# Patient Record
Sex: Female | Born: 1970 | Race: White | Hispanic: No | Smoking: Never smoker
Health system: Southern US, Community
[De-identification: ages and names within clinical notes are randomized; demographics above are authoritative.]

## PROBLEM LIST (undated history)

## (undated) DIAGNOSIS — E039 Hypothyroidism, unspecified: Secondary | ICD-10-CM

## (undated) DIAGNOSIS — F329 Major depressive disorder, single episode, unspecified: Secondary | ICD-10-CM

## (undated) DIAGNOSIS — F32A Depression, unspecified: Secondary | ICD-10-CM

## (undated) DIAGNOSIS — N950 Postmenopausal bleeding: Secondary | ICD-10-CM

## (undated) DIAGNOSIS — D649 Anemia, unspecified: Secondary | ICD-10-CM

## (undated) HISTORY — PX: TUBAL LIGATION: SHX77

## (undated) HISTORY — PX: CARPAL TUNNEL RELEASE: SHX101

---

## 2002-12-06 ENCOUNTER — Other Ambulatory Visit: Admission: RE | Admit: 2002-12-06 | Discharge: 2002-12-06 | Payer: Self-pay | Admitting: Obstetrics and Gynecology

## 2003-06-15 ENCOUNTER — Inpatient Hospital Stay (HOSPITAL_COMMUNITY): Admission: AD | Admit: 2003-06-15 | Discharge: 2003-06-17 | Payer: Self-pay | Admitting: Obstetrics and Gynecology

## 2003-06-18 ENCOUNTER — Encounter: Admission: RE | Admit: 2003-06-18 | Discharge: 2003-07-18 | Payer: Self-pay | Admitting: Obstetrics and Gynecology

## 2003-07-22 ENCOUNTER — Other Ambulatory Visit: Admission: RE | Admit: 2003-07-22 | Discharge: 2003-07-22 | Payer: Self-pay | Admitting: Obstetrics and Gynecology

## 2003-12-10 HISTORY — PX: CARPAL TUNNEL RELEASE: SHX101

## 2004-05-18 ENCOUNTER — Ambulatory Visit (HOSPITAL_COMMUNITY): Admission: RE | Admit: 2004-05-18 | Discharge: 2004-05-18 | Payer: Self-pay | Admitting: Orthopaedic Surgery

## 2004-05-28 ENCOUNTER — Ambulatory Visit (HOSPITAL_BASED_OUTPATIENT_CLINIC_OR_DEPARTMENT_OTHER): Admission: RE | Admit: 2004-05-28 | Discharge: 2004-05-28 | Payer: Self-pay | Admitting: Orthopaedic Surgery

## 2004-12-08 ENCOUNTER — Encounter (INDEPENDENT_AMBULATORY_CARE_PROVIDER_SITE_OTHER): Payer: Self-pay | Admitting: Specialist

## 2004-12-08 ENCOUNTER — Ambulatory Visit (HOSPITAL_COMMUNITY): Admission: RE | Admit: 2004-12-08 | Discharge: 2004-12-08 | Payer: Self-pay | Admitting: Obstetrics and Gynecology

## 2004-12-09 HISTORY — PX: TUBAL LIGATION: SHX77

## 2005-09-26 ENCOUNTER — Inpatient Hospital Stay (HOSPITAL_COMMUNITY): Admission: AD | Admit: 2005-09-26 | Discharge: 2005-09-28 | Payer: Self-pay | Admitting: Obstetrics and Gynecology

## 2005-11-12 ENCOUNTER — Ambulatory Visit (HOSPITAL_COMMUNITY): Admission: RE | Admit: 2005-11-12 | Discharge: 2005-11-12 | Payer: Self-pay | Admitting: Obstetrics and Gynecology

## 2008-11-23 ENCOUNTER — Ambulatory Visit (HOSPITAL_COMMUNITY): Admission: RE | Admit: 2008-11-23 | Discharge: 2008-11-23 | Payer: Self-pay | Admitting: Orthopaedic Surgery

## 2009-01-10 ENCOUNTER — Emergency Department (HOSPITAL_COMMUNITY): Admission: EM | Admit: 2009-01-10 | Discharge: 2009-01-10 | Payer: Self-pay | Admitting: Family Medicine

## 2011-02-06 ENCOUNTER — Other Ambulatory Visit (HOSPITAL_COMMUNITY): Payer: Self-pay | Admitting: Orthopaedic Surgery

## 2011-02-06 DIAGNOSIS — M4802 Spinal stenosis, cervical region: Secondary | ICD-10-CM

## 2011-02-13 ENCOUNTER — Ambulatory Visit (HOSPITAL_COMMUNITY)
Admission: RE | Admit: 2011-02-13 | Discharge: 2011-02-13 | Disposition: A | Discharge status: Home / Self Care | Payer: 59 | Source: Ambulatory Visit | Attending: Orthopaedic Surgery | Admitting: Orthopaedic Surgery

## 2011-02-13 DIAGNOSIS — M6281 Muscle weakness (generalized): Secondary | ICD-10-CM | POA: Insufficient documentation

## 2011-02-13 DIAGNOSIS — M79609 Pain in unspecified limb: Secondary | ICD-10-CM | POA: Insufficient documentation

## 2011-02-13 DIAGNOSIS — M542 Cervicalgia: Secondary | ICD-10-CM | POA: Insufficient documentation

## 2011-02-13 DIAGNOSIS — J320 Chronic maxillary sinusitis: Secondary | ICD-10-CM | POA: Insufficient documentation

## 2011-02-13 DIAGNOSIS — M4802 Spinal stenosis, cervical region: Secondary | ICD-10-CM | POA: Insufficient documentation

## 2011-02-13 DIAGNOSIS — J323 Chronic sphenoidal sinusitis: Secondary | ICD-10-CM | POA: Insufficient documentation

## 2011-04-26 NOTE — Op Note (Signed)
Karen [name], Karen [name]            ACCOUNT NO.:  [id number]   MEDICAL RECORD NO.:  [id number]          PATIENT TYPE:  AMB   LOCATION:  SDC                           FACILITY:  [facility]   PHYSICIAN:  Lenoard Aden, M.D.DATE OF BIRTH:  1971/07/30   DATE OF PROCEDURE:  12/08/2004  DATE OF DISCHARGE:                                 OPERATIVE REPORT   PREOPERATIVE DIAGNOSIS:  Missed abortion.   POSTOPERATIVE DIAGNOSIS:  Missed abortion.   PROCEDURE:  1.  Suction, dilatation and evacuation.  2.  Examination under anesthesia.   SURGEON:  Lenoard Aden, M.D.   ANESTHESIA:  MAC; paracervical.   ESTIMATED BLOOD LOSS:  Less than 50 mL.   COMPLICATIONS:  None.   SPECIMENS:  Products of conception to pathology.   OPERATIVE NOTE:  After being apprised of the risks of anesthesia, infection,  bleeding, uterine perforation and possible need for repair, the patient was  brought to the operating room where she was administered IV sedation without  difficulty, prepped and draped in the usual sterile fashion, catheterized  until the bladder was emptied.  After achieving adequate anesthesia, a  dilute Nesacaine solution was placed, 25 mL, in a standard paracervical  block.  At this time, uterus was dilated easily up to a #21 Pratt dilator, 7-  mm suction curette placed.  Minimal tissue is obtained due to known 6-week  missed Ab with 4-week sac as previously noted on ultrasound, but adequate  tissue is noted.  Blunt curettage reveals the cavity to be empty, repeat  suction confirms.  Good hemostasis is noted.  The procedure is terminated.  The patient is awakened and transferred to recovery in good condition.      RJT/MEDQ  D:  12/08/2004  T:  12/08/2004  Job:  [id number]   cc:   [facility]

## 2011-04-26 NOTE — H&P (Signed)
   Karen [name], ZAPPULLA NO.:  [id number]   MEDICAL RECORD NO.:  [id number]                   PATIENT TYPE:  MAT   LOCATION:  MATC                                 FACILITY:  [facility]   PHYSICIAN:  Lenoard Aden, M.D.             DATE OF BIRTH:  Feb 22, 1971   DATE OF ADMISSION:  06/15/2003  DATE OF DISCHARGE:                                HISTORY & PHYSICAL   CHIEF COMPLAINT:  Oligohydramnios.   HISTORY OF PRESENT ILLNESS:  The patient is a 40 year old, white female, G1,  P0, EDC July 05, 2003 at [redacted] weeks gestation with persistent refractory  oligohydramnios and an AFI of 6.5 today.   MEDICATIONS:  Prenatal vitamins.   ALLERGIES:  No known drug allergies.   OBSTETRIC HISTORY:  Noncontributory.   PAST MEDICAL HISTORY:  Remarkable for history of migraine headaches and  wisdom teeth removal.   FAMILY HISTORY:  Myocardial infarction, heart disease, hypertension, insulin  dependent diabetes and smoking abuse.   PRENATAL LAB DATA:  Reveals a blood type of O positive, Rh antibody  negative, rubella immune, hepatitis and HIV negative. GBS negative.   PHYSICAL EXAMINATION:  GENERAL:  She is a well-developed, well-nourished,  white female in no acute distress.  HEENT:  Normal.  LUNGS:  Clear.  HEART:  Regular rhythm.  ABDOMEN:  Soft, gravid, nontender. Estimated fetal weight on ultrasound of 7  pounds. Cervix is 4 cm, 80% , vertex -1  EXTREMITIES:  Show no cores.  NEUROLOGIC:  Nonfocal.   IMPRESSION:  1. 37 week intrauterine pregnancy.  2. Refractory oligohydramnios with AFI decreased with bed rest.   PLAN:  Proceed with induction of labor, anticipate attempts at vaginal  delivery.                                               Lenoard Aden, M.D.   RJT/MEDQ  D:  06/15/2003  T:  06/15/2003  Job:  [id number]

## 2011-04-26 NOTE — Op Note (Signed)
Karen [name], POLITTE                        ACCOUNT NO.:  [id number]   MEDICAL RECORD NO.:  [id number]                   PATIENT TYPE:  AMB   LOCATION:  DSC                                  FACILITY:  [facility]   PHYSICIAN:  Mark C. Ophelia Charter, M.D.                 DATE OF BIRTH:  Nov 17, 1971   DATE OF PROCEDURE:  05/28/2004  DATE OF DISCHARGE:                                 OPERATIVE REPORT   PREOPERATIVE DIAGNOSIS:  Right carpal tunnel syndrome.   POSTOPERATIVE DIAGNOSIS:  Right carpal tunnel syndrome.   PROCEDURE:  Right carpal tunnel release.   SURGEON:  Mark C. Ophelia Charter, M.D.   ANESTHESIA:  IV sedation plus local 0.5% Marcaine plus 1% Xylocaine 1:1  mixture.   TOURNIQUET TIME:  8 minutes.   PROCEDURE:  After standard prepping and draping with 8-0 Vicryl sedation, a  sterile skin marker was used and incision was made following the thenar  crease.  The transverse carpal ligament was divided distally and then  followed proximally along the ulnar border of the median nerve, dividing the  transverse carpal ligament completely.  A fingertip could be placed  proximally and distally.  There were chronic tenosynovitis changes, no  masses underneath the nerve after irrigation with saline solution.  The  tourniquet was deflated, small bleeders controlled with bipolar cautery.  The skin was closed with 4-0 nylon AO suture, mattress technique, Xeroform,  4 x 4's, Webril, and a dorsal wrist splint with the wrist in slight  extension.  Instrument count and needle count was correct.                                               Mark C. Ophelia Charter, M.D.    MCY/MEDQ  D:  05/28/2004  T:  05/29/2004  Job:  [id number]

## 2011-04-26 NOTE — H&P (Signed)
NAMELEKIA, NIER            ACCOUNT NO.:  [id number]   MEDICAL RECORD NO.:  [id number]          PATIENT TYPE:  AMB   LOCATION:  SDC                           [facility]ILITY[facility]  [facility]   PHYSICIAN:  Lenoard Aden, M.D.DATE OF BIRTH:  Mar 01, 1971   DATE OF ADMISSION:  12/08/2004  DATE OF DISCHARGE:                                HISTORY & PHYSICAL   CHIEF COMPLAINT  Missed abortion.   HISTORY OF PRESENT ILLNESS:  The patient is a 40 year old white female G2,  P1, with missed abortion at six weeks gestation for definitive care.   MEDICATIONS:  Prenatal vitamins.   ALLERGIES:  No known drug allergies.   OBSTETRIC HISTORY:  One vaginal delivery in 2004, history of migraine  headaches and wisdom tooth removal.   FAMILY HISTORY:  Myocardial infarction, heart disease, hypertension, insulin  dependent diabetes mellitus, smoking abuse.   Prenatal labs revealed a blood type of O positive.   PHYSICAL EXAMINATION:  GENERAL:  Well developed, well nourished white female in no acute distress.  HEENT:  Normal.  LUNGS:  Clear.  HEART:  Regular rhythm.  ABDOMEN:  Soft, nontender.  Uterus consistent with six weeks size.  EXTREMITIES:  No edema.  NEUROLOGICAL:  Nonfocal.   IMPRESSION:  Six week intrauterine pregnancy with missed abortion.   PLAN:  Proceed with suction D&E.  The risks of anesthesia, infection,  bleeding, injury to abdominal organs were discussed, complications to  include bowel and bladder injury with uterine perforation discussed.  The  patient acknowledges and wishes to proceed.      RJT/MEDQ  D:  12/07/2004  T:  12/07/2004  Job:  [id number]

## 2011-04-26 NOTE — H&P (Signed)
NAME:  Karen [name], MUNNERLYN NO.:  [id number]   MEDICAL RECORD NO.:  [id number]          PATIENT TYPE:  MAT   LOCATION:  [facility]C                          [facility]ILITY[facility]   PHYSICIAN:  Lenoard Aden, M.D.DATE OF BIRTH:  [birth year]   DATE OF ADMISSION:  DATE OF DISCHARGE:                                HISTORY & PHYSICAL   CHIEF COMPLAINT:  History of precipitous labor.   She is a 40 year old white female, G3, P1, EDC of October 07, 2005 at [redacted]  weeks gestation for induction. She has a history of spontaneous vaginal  delivery, missed AB with D&E.   MEDICATIONS:  Prenatal vitamins.   ALLERGIES:  None.   OBSTETRICAL HISTORY:  As noted.   PAST MEDICAL HISTORY:  Remarkable for migraine headaches and wisdom tooth  removal.   FAMILY HISTORY:  Myocardial infarction, heart disease, hypertension, insulin-  dependent diabetes, and smoking abuse.   PRENATAL LABORATORY DATA:  Blood type O positive. Rh antibody negative.  Rubella immuned. Hepatitis and HIV negative. GBS is negative.   PHYSICAL EXAMINATION:  GENERAL:  She is a well-developed, well-nourished  white female in no acute distress.  HEENT:  Normal.  LUNGS:  Clear.  HEART:  Regular rate and rhythm.  ABDOMEN:  Soft, gravid, nontender. Estimated fetal weight of 7 pounds.  CERVIX:  3 cm dilated, 2 cm soft, vertex -1.  EXTREMITIES:  No cords.  NEUROLOGICAL:  Nonfocal.   IMPRESSION:  A 39-week intrauterine pregnancy for elective induction due to  maternal discomfort and favorable cervical status.   PLAN:  Proceed with induction with epidural as needed. Anticipate attempt at  vaginal delivery.      Lenoard Aden, M.D.  Electronically Signed     RJT/MEDQ  D:  09/25/2005  T:  09/25/2005  Job:  [id number]

## 2011-04-26 NOTE — Op Note (Signed)
[name], Karen [name]       ACCOUNT NO.:  [id number]   MEDICAL RECORD NO.:  [id number]          PATIENT TYPE:  AMB   LOCATION:  SDC                           [facility]   PHYSICIAN:  Lenoard Aden, M.D.DATE OF BIRTH:  14-Aug-1971   DATE OF PROCEDURE:  11/12/2005  DATE OF DISCHARGE:                                 OPERATIVE REPORT   PREOPERATIVE DIAGNOSIS:  Desire for elective sterilization.   POSTOPERATIVE DIAGNOSIS:  Desire for elective sterilization.   OPERATION/PROCEDURE:  Laparoscopic tubal sterilization.   SURGEON:  Lenoard Aden, M.D.   ANESTHESIA:  General by Maren Beach, M.D.   ESTIMATED BLOOD LOSS:  Less than 50 mL.   COMPLICATIONS:  None.   DRAINS:  None.   COUNTS:  Correct.   DISPOSITION:  Patient to recovery in good condition.   DESCRIPTION OF PROCEDURE:  After being apprised of the risks of anesthesia,  infection, bleeding, injury to abdominal organs and need for repair delayed  or unforeseen complications to include bowel and bladder injury,  the  patient was brought to the operating room where she was administered a  general anesthetic without complications.  She was placed in the dorsal  lithotomy position.  Feet were in the ___________ stirrups.  Hulka tenaculum  was placed per vagina without complications.  The patient was prepped and  draped in the usual sterile fashion and catheterized until the bladder was  empty.  After this time infraumbilical incision was made with the scalpel.  Veress needle was placed.  Opening pressure -2 noted.  A 0.5 L CO2 was  insufflated without difficulty.  Trocar placed atraumatic.  Trocar entry  visualized.  Pictures are taken.  Normal liver and gallbladder bed.  Normal  appendiceal area.  Normal anterior posterior cul-de-sac.  Normal uterus.  Normal tubes and normal ovaries.  Right tube is traced out to the fimbriated  end.  Ampulla isthmic portion of the tube is cauterized using bipolar  cautery  to a resistance of 0 in three contiguous areas.  The same procedure  on the left tube.  Both tubes were then divided using scissors.  Tubal  lumens were visualized.  Good hemostasis is noted.  Instruments are removed.  CO2 is released and 0 Vicryl interrupted suture placed in the umbilicus.  Dermabond placed.  The patient tolerated the procedure well and was  transferred to recovery in good condition.      Lenoard Aden, M.D.  Electronically Signed     RJT/MEDQ  D:  11/12/2005  T:  11/12/2005  Job:  [id number]

## 2011-05-14 ENCOUNTER — Other Ambulatory Visit: Payer: Self-pay | Admitting: Obstetrics and Gynecology

## 2011-12-14 ENCOUNTER — Emergency Department (HOSPITAL_COMMUNITY)
Admission: EM | Admit: 2011-12-14 | Discharge: 2011-12-14 | Disposition: A | Discharge status: Home / Self Care | Payer: 59 | Source: Home / Self Care | Attending: Emergency Medicine | Admitting: Emergency Medicine

## 2011-12-14 ENCOUNTER — Encounter: Payer: Self-pay | Admitting: *Deleted

## 2011-12-14 DIAGNOSIS — J40 Bronchitis, not specified as acute or chronic: Secondary | ICD-10-CM

## 2011-12-14 DIAGNOSIS — J069 Acute upper respiratory infection, unspecified: Secondary | ICD-10-CM

## 2011-12-14 DIAGNOSIS — J329 Chronic sinusitis, unspecified: Secondary | ICD-10-CM

## 2011-12-14 HISTORY — DX: Major depressive disorder, single episode, unspecified: F32.9

## 2011-12-14 HISTORY — DX: Depression, unspecified: F32.A

## 2011-12-14 MED ORDER — HYDROCOD POLST-CHLORPHEN POLST 10-8 MG/5ML PO LQCR: 5.0000 mL | Freq: Two times a day (BID) | ORAL | Status: DC | PRN

## 2011-12-14 MED ORDER — AMOXICILLIN-POT CLAVULANATE 875-125 MG PO TABS: 1.0000 | ORAL_TABLET | Freq: Two times a day (BID) | ORAL | Status: AC

## 2011-12-14 NOTE — ED Provider Notes (Signed)
Chief Complaint  Patient presents with  . Nasal Congestion  . 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 [employer] had a four-day history of cough productive of thick yellowish sputum, nasal congestion with yellow-green drainage, sinus pressure, heaviness in the chest, chest hurts to cough, has felt hot and cold, and has had a sore throat. She denies any difficulty breathing or wheezing. She's had no earache, no nausea, vomiting, or diarrhea. She has been exposed to several patients that had pneumonia. She has gotten the flu vaccine.  Review of Systems:  Other than noted above, the patient denies any of the following symptoms. Systemic:  No fever, chills, sweats, fatigue, myalgias, headache, or anorexia. Eye:  No redness, pain or drainage. ENT:  No earache, nasal congestion, rhinorrhea, sinus pressure, or sore throat. Lungs:  No cough, sputum production, wheezing, shortness of breath. Or chest pain. GI:  No nausea, vomiting, abdominal pain or diarrhea.  PMFSH:  Past medical history, family history, social history, meds, and allergies were reviewed.  Physical Exam:   Vital signs:  BP 124/53  Pulse 92  Temp(Src) 99.6 F (37.6 C) (Oral)  Resp 16  SpO2 100%  LMP 12/12/2011 General:  Alert, in no distress. Eye:  No conjunctival injection or drainage. ENT:  TMs and canals were normal, without erythema or inflammation.  Nasal mucosa was clear and uncongested, without drainage.  Mucous membranes were moist.  Pharynx was clear, without exudate or drainage.  There were no oral ulcerations or lesions. Neck:  Supple, no adenopathy, tenderness or mass. Lungs:  No respiratory distress.  Lungs were clear to auscultation, without wheezes, rales or rhonchi.  Breath sounds were clear and equal bilaterally. Heart:  Regular rhythm, without gallops, murmers or rubs. Skin:  Clear, warm, and dry, without rash or lesions.  Labs:   Results for orders placed during the  hospital encounter of 01/10/09  POCT RAPID STREP A      Component Value Range   Streptococcus, Group A Screen (Direct) POSITIVE (*) NEGATIVE      Radiology:  No results found.  Medications given in UCC:  None  Assessment:   Diagnoses that have been ruled out:  Diagnoses that are still under consideration:  Final diagnoses:  Upper respiratory infection  Sinusitis  Bronchitis    Plan:   1.  The following meds were prescribed:   New Prescriptions   AMOXICILLIN-CLAVULANATE (AUGMENTIN) 875-125 MG PER TABLET    Take 1 tablet by mouth 2 (two) times daily.   CHLORPHENIRAMINE-HYDROCODONE (TUSSIONEX) 10-8 MG/5ML LQCR    Take 5 mLs by mouth every 12 (twelve) hours as needed.   2.  The patient was instructed in symptomatic care and handouts were given. 3.  The patient was told to return if becoming worse in any way, if no better in 3 or 4 days, and given some red flag symptoms that would indicate earlier return.   Roque Lias, MD 12/14/11 [phone]

## 2011-12-14 NOTE — ED Notes (Signed)
Cough congestion x 3 days

## 2012-08-20 ENCOUNTER — Other Ambulatory Visit (HOSPITAL_COMMUNITY): Payer: Self-pay | Admitting: Orthopaedic Surgery

## 2012-08-20 DIAGNOSIS — M542 Cervicalgia: Secondary | ICD-10-CM

## 2012-08-24 ENCOUNTER — Ambulatory Visit (HOSPITAL_COMMUNITY)
Admission: RE | Admit: 2012-08-24 | Discharge: 2012-08-24 | Disposition: A | Discharge status: Home / Self Care | Payer: 59 | Source: Ambulatory Visit | Attending: Orthopaedic Surgery | Admitting: Orthopaedic Surgery

## 2012-08-24 ENCOUNTER — Inpatient Hospital Stay (HOSPITAL_COMMUNITY)
Admission: RE | Admit: 2012-08-24 | Discharge: 2012-08-24 | Payer: 59 | Source: Ambulatory Visit | Attending: Orthopaedic Surgery | Admitting: Orthopaedic Surgery

## 2012-08-24 DIAGNOSIS — R5381 Other malaise: Secondary | ICD-10-CM | POA: Insufficient documentation

## 2012-08-24 DIAGNOSIS — M79609 Pain in unspecified limb: Secondary | ICD-10-CM | POA: Insufficient documentation

## 2012-08-24 DIAGNOSIS — M542 Cervicalgia: Secondary | ICD-10-CM | POA: Insufficient documentation

## 2012-10-15 ENCOUNTER — Encounter (HOSPITAL_COMMUNITY): Payer: Self-pay

## 2012-10-15 ENCOUNTER — Ambulatory Visit (HOSPITAL_COMMUNITY)
Admission: RE | Admit: 2012-10-15 | Discharge: 2012-10-15 | Disposition: A | Discharge status: Home / Self Care | Payer: 59 | Source: Ambulatory Visit | Attending: Orthopaedic Surgery | Admitting: Orthopaedic Surgery

## 2012-10-15 DIAGNOSIS — Z01812 Encounter for preprocedural laboratory examination: Secondary | ICD-10-CM | POA: Insufficient documentation

## 2012-10-15 DIAGNOSIS — Z01818 Encounter for other preprocedural examination: Secondary | ICD-10-CM | POA: Insufficient documentation

## 2012-10-15 HISTORY — DX: Anemia, unspecified: D64.9

## 2012-10-15 LAB — CBC
HCT: 41.9 % (ref 36.0–46.0)
MCH: 25.9 pg — ABNORMAL LOW (ref 26.0–34.0)
MCHC: 31.5 g/dL (ref 30.0–36.0)
MCV: 82.2 fL (ref 78.0–100.0)
WBC: 8.9 10*3/uL (ref 4.0–10.5)

## 2012-10-15 LAB — SURGICAL PCR SCREEN: MRSA, PCR: NEGATIVE

## 2012-10-15 NOTE — Progress Notes (Signed)
Dr. Ophelia Charter office notified that orders are needed for patient.

## 2012-10-15 NOTE — Pre-Procedure Instructions (Signed)
20 Karen [name]  10/15/2012   Your procedure is scheduled on:  10-23-2012  Report to [facility] at 5:30 AM.  Karen [name] to 3rd floor  Call this number if you have problems the morning of surgery: [phone]   Remember:   Do not eat food or drink:After Midnight.   .  Take these medicines the morning of surgery with A SIP OF WATER: lexapro,pain medication as needed,methocarbamol if needed,   Do not wear jewelry, make-up or nail polish.  Do not wear lotions, powders, or perfumes. You may wear deodorant.  Do not shave 48 hours prior to surgery  Do not bring valuables to the hospital.  Contacts, dentures or bridgework may not be worn into surgery.  Leave suitcase in the car. After surgery it may be brought to your room.   For patients admitted to the hospital, checkout time is 11:00 AM the day of discharge.   Patients discharged the day of surgery will not be allowed to drive home.      Special Instructions: Shower using CHG 2 nights before surgery and the night before surgery.  If you shower the day of surgery use CHG.  Use special wash - you have one bottle of CHG for all showers.  You should use approximately 1/3 of the bottle for each shower.     Please read over the following fact sheets that you were given: Pain Booklet, MRSA Information and Surgical Site Infection Prevention

## 2012-10-19 ENCOUNTER — Other Ambulatory Visit (HOSPITAL_COMMUNITY): Payer: Self-pay | Admitting: Orthopaedic Surgery

## 2012-10-22 MED ORDER — CEFAZOLIN SODIUM-DEXTROSE 2-3 GM-% IV SOLR
2.0000 g | INTRAVENOUS | Status: AC
  Administered 2012-10-23: 2 g via INTRAVENOUS
  Filled 2012-10-22: qty 50

## 2012-10-22 NOTE — H&P (Signed)
Karen [name] is an 41 y.o. female.   Chief Complaint: neck pain with right arm pain, numbness and tingling HPI:  She is having persistent problems with her cervical spondylosis and right upper extremity numbness.  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  Has increased problems when she works.  She has some difficulty sleeping.  She has to take Vicodin at night.  She is using anti-inflammatories during the day.  She has been concerned the anti-inflammatories will give her some problems with her stomach and has been taking them sometimes every other day.  She has had previous carpal tunnel release on the right side.  Most of her symptoms are on the right.  Numbness radiates down to the radial side of her hand.  It is worse with activity.  She denies any bowel or bladder symptoms.  No lower extremity weakness.  No falling or stumbling. She has used home cervical traction.  Formal physical therapy was ordered.  However, her out-of-pocket cost was $40.00 per visit and the cost was prohibitive for her.  She has used the anti-inflammatories and has used some heat, ice, heating pad and home cervical traction regularly which gives her a little bit of improvement.   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  She has arm pain, numbness in her hands, difficulty sleeping at night.  She states she is ready to proceed with surgery in the form of anterior cervical discectomy and fusion.   Her MRI scan shows disk desiccation with bulging with mild stenosis at C5-6 and C6-7.  She has more symptoms at C5-6 on the right which is consistent with her intermittent hand numbness.  Her symptoms have been present since 2008 and she has been followed for the last 5 years for gradual progression  Past Medical History  Diagnosis Date  . Depression   . Anemia     Past  Surgical History  Procedure Date  . Carpal tunnel release   . Tubal ligation     History reviewed. No pertinent family history. Social History:  reports that she has never smoked. She does not have any smokeless tobacco history on file. She reports that she drinks alcohol. She reports that she does not use illicit drugs.  Allergies: No Known Allergies  Medications Prior to Admission  Medication Sig Dispense Refill  . docusate sodium (COLACE) 100 MG capsule Take 200 mg by mouth daily.      Marland Kitchen escitalopram (LEXAPRO) 20 MG tablet Take 20 mg by mouth daily.      Marland Kitchen guaiFENesin (MUCINEX) 600 MG 12 hr tablet Take 1,200 mg by mouth 2 (two) times daily as needed. For allergies      . HYDROcodone-acetaminophen (NORCO/VICODIN) 5-325 MG per tablet Take 1-2 tablets by mouth 2 (two) times daily as needed. For pain      . methocarbamol (ROBAXIN) 500 MG tablet Take 500 mg by mouth every 6 (six) hours as needed. For spasms      . norethindrone-ethinyl estradiol (JUNEL FE,GILDESS FE,LOESTRIN FE) 1-20 MG-MCG tablet Take 1 tablet by mouth daily.        Results for orders placed during the hospital encounter of 10/23/12 (from the past 48 hour(s))  COMPREHENSIVE METABOLIC PANEL     Status: Abnormal   Collection Time   10/23/12  5:50 AM      Component  Value Range Comment   Sodium 136  135 - 145 mEq/L    Potassium 3.4 (*) 3.5 - 5.1 mEq/L    Chloride 102  96 - 112 mEq/L    CO2 24  19 - 32 mEq/L    Glucose, Bld 123 (*) 70 - 99 mg/dL    BUN 12  6 - 23 mg/dL    Creatinine, Ser 4.09  0.50 - 1.10 mg/dL    Calcium 9.3  8.4 - 81.1 mg/dL    Total Protein 7.2  6.0 - 8.3 g/dL    Albumin 3.5  3.5 - 5.2 g/dL    AST 14  0 - 37 U/L    ALT 18  0 - 35 U/L    Alkaline Phosphatase 60  39 - 117 U/L    Total Bilirubin 0.5  0.3 - 1.2 mg/dL    GFR calc non Af Amer >90  >90 mL/min    GFR calc Af Amer >90  >90 mL/min   URINALYSIS, ROUTINE W REFLEX MICROSCOPIC     Status: Abnormal   Collection Time   10/23/12  6:12 AM       Component Value Range Comment   Color, Urine YELLOW  YELLOW    APPearance CLOUDY (*) CLEAR    Specific Gravity, Urine 1.020  1.005 - 1.030    pH 5.0  5.0 - 8.0    Glucose, UA NEGATIVE  NEGATIVE mg/dL    Hgb urine dipstick NEGATIVE  NEGATIVE    Bilirubin Urine NEGATIVE  NEGATIVE    Ketones, ur NEGATIVE  NEGATIVE mg/dL    Protein, ur NEGATIVE  NEGATIVE mg/dL    Urobilinogen, UA 0.2  0.0 - 1.0 mg/dL    Nitrite NEGATIVE  NEGATIVE    Leukocytes, UA SMALL (*) NEGATIVE   URINE MICROSCOPIC-ADD ON     Status: Abnormal   Collection Time   10/23/12  6:12 AM      Component Value Range Comment   Squamous Epithelial / LPF FEW (*) RARE    WBC, UA 0-2  <3 WBC/hpf    RBC / HPF 0-2  <3 RBC/hpf    Bacteria, UA RARE  RARE    No results found.  Review of Systems  Constitutional: Negative.   HENT: Positive for neck pain.   Eyes: Negative.   Respiratory: Negative.   Cardiovascular: Negative.   Gastrointestinal: Negative.   Genitourinary: Negative.   Skin: Negative.   Neurological: Positive for tingling.       Right arm  Endo/Heme/Allergies: Negative.   Psychiatric/Behavioral: Negative.     Blood pressure 131/66, pulse 92, temperature 98.2 F (36.8 C), temperature source Oral, resp. rate 18, SpO2 98.00%. Physical Exam  Constitutional: She is oriented to person, place, and time. She appears well-developed and well-nourished.  HENT:  Head: Normocephalic and atraumatic.  Eyes: EOM are normal. Pupils are equal, round, and reactive to light.  Neck:       Decreased ROM cspine  Cardiovascular: Normal rate.   Respiratory: Effort normal.  GI: Soft.  Musculoskeletal:       PHYSICAL EXAMINATION:  The patient is alert and oriented in mild distress.  Height 5 feet 2 inches, weight 174 pounds.  She has brachial plexus tenderness on the right.  Upper extremity reflexes are 1+ and symmetric and normal lower extremity reflexes.  Positive Spurling on the right and negative on the left  Neurological: She  is alert and oriented to person, place, and time.  Skin: Skin is warm  and dry.  Psychiatric: She has a normal mood and affect.     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  Ireene Ballowe C 10/23/2012, 7:20 AM

## 2012-10-23 ENCOUNTER — Encounter (HOSPITAL_COMMUNITY): Payer: Self-pay | Admitting: Anesthesiology

## 2012-10-23 ENCOUNTER — Ambulatory Visit (HOSPITAL_COMMUNITY): Payer: 59

## 2012-10-23 ENCOUNTER — Encounter (HOSPITAL_COMMUNITY)
Admission: RE | Disposition: A | Discharge status: Home / Self Care | Payer: Self-pay | Source: Ambulatory Visit | Attending: Orthopaedic Surgery

## 2012-10-23 ENCOUNTER — Encounter (HOSPITAL_COMMUNITY): Payer: Self-pay | Admitting: *Deleted

## 2012-10-23 ENCOUNTER — Ambulatory Visit (HOSPITAL_COMMUNITY): Payer: 59 | Admitting: Anesthesiology

## 2012-10-23 ENCOUNTER — Inpatient Hospital Stay (HOSPITAL_COMMUNITY)
Admission: RE | Admit: 2012-10-23 | Discharge: 2012-10-24 | DRG: 473 | Disposition: A | Discharge status: Home / Self Care | Payer: 59 | Source: Ambulatory Visit | Attending: Orthopaedic Surgery | Admitting: Orthopaedic Surgery

## 2012-10-23 DIAGNOSIS — F329 Major depressive disorder, single episode, unspecified: Secondary | ICD-10-CM | POA: Diagnosis present

## 2012-10-23 DIAGNOSIS — M47812 Spondylosis without myelopathy or radiculopathy, cervical region: Principal | ICD-10-CM | POA: Diagnosis present

## 2012-10-23 DIAGNOSIS — F3289 Other specified depressive episodes: Secondary | ICD-10-CM | POA: Diagnosis present

## 2012-10-23 HISTORY — PX: ANTERIOR CERVICAL DECOMP/DISCECTOMY FUSION: SHX1161

## 2012-10-23 LAB — COMPREHENSIVE METABOLIC PANEL
Albumin: 3.5 g/dL (ref 3.5–5.2)
BUN: 12 mg/dL (ref 6–23)
Calcium: 9.3 mg/dL (ref 8.4–10.5)
GFR calc Af Amer: >90 mL/min (ref >90)
Glucose, Bld: 123 mg/dL — ABNORMAL HIGH (ref 70–99)
Sodium: 136 mEq/L (ref 135–145)
Total Protein: 7.2 g/dL (ref 6.0–8.3)

## 2012-10-23 LAB — URINALYSIS, ROUTINE W REFLEX MICROSCOPIC
Nitrite: NEGATIVE
Specific Gravity, Urine: 1.02 (ref 1.005–1.030)
pH: 5 (ref 5.0–8.0)

## 2012-10-23 LAB — URINE MICROSCOPIC-ADD ON

## 2012-10-23 SURGERY — ANTERIOR CERVICAL DECOMPRESSION/DISCECTOMY FUSION 2 LEVELS
Anesthesia: General | Site: Neck | Wound class: Clean

## 2012-10-23 MED ORDER — MIDAZOLAM HCL 5 MG/5ML IJ SOLN
INTRAMUSCULAR | Status: DC | PRN
  Administered 2012-10-23: 2 mg via INTRAVENOUS

## 2012-10-23 MED ORDER — DOCUSATE SODIUM 100 MG PO CAPS
200.0000 mg | ORAL_CAPSULE | Freq: Every day | ORAL | Status: DC
  Filled 2012-10-23: qty 2

## 2012-10-23 MED ORDER — HYDROCODONE-ACETAMINOPHEN 5-325 MG PO TABS
1.0000 | ORAL_TABLET | ORAL | Status: DC | PRN
  Administered 2012-10-23 – 2012-10-24 (×2): 2 via ORAL
  Filled 2012-10-23 (×2): qty 2

## 2012-10-23 MED ORDER — LIDOCAINE HCL (CARDIAC) 20 MG/ML IV SOLN
INTRAVENOUS | Status: DC | PRN
  Administered 2012-10-23: 50 mg via INTRAVENOUS

## 2012-10-23 MED ORDER — ONDANSETRON HCL 4 MG/2ML IJ SOLN
4.0000 mg | INTRAMUSCULAR | Status: DC | PRN
  Administered 2012-10-23: 4 mg via INTRAVENOUS
  Filled 2012-10-23: qty 2

## 2012-10-23 MED ORDER — METHOCARBAMOL 500 MG PO TABS: ORAL_TABLET | ORAL | Status: DC

## 2012-10-23 MED ORDER — BISACODYL 10 MG RE SUPP: 10.0000 mg | Freq: Every day | RECTAL | Status: DC | PRN

## 2012-10-23 MED ORDER — NEOSTIGMINE METHYLSULFATE 1 MG/ML IJ SOLN
INTRAMUSCULAR | Status: DC | PRN
  Administered 2012-10-23: 2 mg via INTRAVENOUS

## 2012-10-23 MED ORDER — PHENOL 1.4 % MT LIQD: 1.0000 | OROMUCOSAL | Status: DC | PRN

## 2012-10-23 MED ORDER — SODIUM CHLORIDE 0.9 % IV SOLN
250.0000 mL | INTRAVENOUS | Status: DC
  Administered 2012-10-23: 250 mL via INTRAVENOUS

## 2012-10-23 MED ORDER — HYDROMORPHONE HCL PF 1 MG/ML IJ SOLN
INTRAMUSCULAR | Status: DC | PRN
  Administered 2012-10-23: .5 mg via INTRAVENOUS

## 2012-10-23 MED ORDER — PANTOPRAZOLE SODIUM 40 MG IV SOLR
40.0000 mg | Freq: Every day | INTRAVENOUS | Status: DC
  Filled 2012-10-23 (×2): qty 40

## 2012-10-23 MED ORDER — HYDROMORPHONE HCL PF 1 MG/ML IJ SOLN
INTRAMUSCULAR | Status: AC
  Filled 2012-10-23: qty 1

## 2012-10-23 MED ORDER — DROPERIDOL 2.5 MG/ML IJ SOLN
INTRAMUSCULAR | Status: DC | PRN
  Administered 2012-10-23: 0.625 mg via INTRAVENOUS

## 2012-10-23 MED ORDER — BUPIVACAINE-EPINEPHRINE 0.25% -1:200000 IJ SOLN
INTRAMUSCULAR | Status: DC | PRN
  Administered 2012-10-23: 7 mL

## 2012-10-23 MED ORDER — FENTANYL CITRATE 0.05 MG/ML IJ SOLN
INTRAMUSCULAR | Status: DC | PRN
  Administered 2012-10-23 (×2): 50 ug via INTRAVENOUS
  Administered 2012-10-23: 150 ug via INTRAVENOUS

## 2012-10-23 MED ORDER — NORETHIN ACE-ETH ESTRAD-FE 1-20 MG-MCG PO TABS: 1.0000 | ORAL_TABLET | Freq: Every day | ORAL | Status: DC

## 2012-10-23 MED ORDER — GUAIFENESIN ER 600 MG PO TB12
1200.0000 mg | ORAL_TABLET | Freq: Two times a day (BID) | ORAL | Status: DC | PRN
  Filled 2012-10-23: qty 2

## 2012-10-23 MED ORDER — SENNOSIDES-DOCUSATE SODIUM 8.6-50 MG PO TABS: 1.0000 | ORAL_TABLET | Freq: Every evening | ORAL | Status: DC | PRN

## 2012-10-23 MED ORDER — ACETAMINOPHEN 325 MG PO TABS: 650.0000 mg | ORAL_TABLET | ORAL | Status: DC | PRN

## 2012-10-23 MED ORDER — SODIUM CHLORIDE 0.9 % IJ SOLN: 3.0000 mL | INTRAMUSCULAR | Status: DC | PRN

## 2012-10-23 MED ORDER — METHOCARBAMOL 500 MG PO TABS: 500.0000 mg | ORAL_TABLET | Freq: Four times a day (QID) | ORAL | Status: DC | PRN

## 2012-10-23 MED ORDER — KCL IN DEXTROSE-NACL 20-5-0.45 MEQ/L-%-% IV SOLN
INTRAVENOUS | Status: DC
  Administered 2012-10-23: 75 mL/h via INTRAVENOUS
  Filled 2012-10-23 (×3): qty 1000

## 2012-10-23 MED ORDER — HYDROMORPHONE HCL PF 1 MG/ML IJ SOLN
0.2500 mg | INTRAMUSCULAR | Status: DC | PRN
  Administered 2012-10-23 (×4): 0.5 mg via INTRAVENOUS

## 2012-10-23 MED ORDER — DEXAMETHASONE SODIUM PHOSPHATE 4 MG/ML IJ SOLN
INTRAMUSCULAR | Status: DC | PRN
  Administered 2012-10-23: 4 mg via INTRAVENOUS

## 2012-10-23 MED ORDER — ESCITALOPRAM OXALATE 20 MG PO TABS
20.0000 mg | ORAL_TABLET | Freq: Every day | ORAL | Status: DC
  Filled 2012-10-23 (×2): qty 1

## 2012-10-23 MED ORDER — MENTHOL 3 MG MT LOZG: 1.0000 | LOZENGE | OROMUCOSAL | Status: DC | PRN

## 2012-10-23 MED ORDER — PHENYLEPHRINE HCL 10 MG/ML IJ SOLN
INTRAMUSCULAR | Status: DC | PRN
  Administered 2012-10-23: 80 ug via INTRAVENOUS

## 2012-10-23 MED ORDER — VECURONIUM BROMIDE 10 MG IV SOLR
INTRAVENOUS | Status: DC | PRN
  Administered 2012-10-23: 10 mg via INTRAVENOUS

## 2012-10-23 MED ORDER — FLEET ENEMA 7-19 GM/118ML RE ENEM: 1.0000 | ENEMA | Freq: Once | RECTAL | Status: AC | PRN

## 2012-10-23 MED ORDER — ZOLPIDEM TARTRATE 5 MG PO TABS: 5.0000 mg | ORAL_TABLET | Freq: Every evening | ORAL | Status: DC | PRN

## 2012-10-23 MED ORDER — HYDROMORPHONE HCL PF 1 MG/ML IJ SOLN
0.5000 mg | INTRAMUSCULAR | Status: DC | PRN
  Administered 2012-10-24: 1 mg via INTRAVENOUS
  Filled 2012-10-23: qty 1

## 2012-10-23 MED ORDER — KETOROLAC TROMETHAMINE 30 MG/ML IJ SOLN
30.0000 mg | Freq: Once | INTRAMUSCULAR | Status: AC
  Administered 2012-10-23: 30 mg via INTRAVENOUS

## 2012-10-23 MED ORDER — ACETAMINOPHEN 650 MG RE SUPP: 650.0000 mg | RECTAL | Status: DC | PRN

## 2012-10-23 MED ORDER — PROPOFOL 10 MG/ML IV BOLUS
INTRAVENOUS | Status: DC | PRN
  Administered 2012-10-23: 200 mg via INTRAVENOUS

## 2012-10-23 MED ORDER — SODIUM CHLORIDE 0.9 % IJ SOLN: 3.0000 mL | Freq: Two times a day (BID) | INTRAMUSCULAR | Status: DC

## 2012-10-23 MED ORDER — ONDANSETRON HCL 4 MG/2ML IJ SOLN
INTRAMUSCULAR | Status: DC | PRN
  Administered 2012-10-23: 4 mg via INTRAVENOUS

## 2012-10-23 MED ORDER — METHOCARBAMOL 100 MG/ML IJ SOLN
500.0000 mg | Freq: Four times a day (QID) | INTRAVENOUS | Status: DC | PRN
  Administered 2012-10-23: 500 mg via INTRAVENOUS
  Filled 2012-10-23: qty 5

## 2012-10-23 MED ORDER — 0.9 % SODIUM CHLORIDE (POUR BTL) OPTIME
TOPICAL | Status: DC | PRN
  Administered 2012-10-23: 1000 mL

## 2012-10-23 MED ORDER — GLYCOPYRROLATE 0.2 MG/ML IJ SOLN
INTRAMUSCULAR | Status: DC | PRN
  Administered 2012-10-23: .4 mg via INTRAVENOUS
  Administered 2012-10-23: 0.2 mg via INTRAVENOUS

## 2012-10-23 MED ORDER — ARTIFICIAL TEARS OP OINT
TOPICAL_OINTMENT | OPHTHALMIC | Status: DC | PRN
  Administered 2012-10-23: 1 via OPHTHALMIC

## 2012-10-23 MED ORDER — LACTATED RINGERS IV SOLN
INTRAVENOUS | Status: DC | PRN
  Administered 2012-10-23 (×2): via INTRAVENOUS

## 2012-10-23 MED ORDER — OXYCODONE-ACETAMINOPHEN 5-325 MG PO TABS: ORAL_TABLET | ORAL | Status: DC

## 2012-10-23 MED ORDER — BUPIVACAINE-EPINEPHRINE PF 0.25-1:200000 % IJ SOLN
INTRAMUSCULAR | Status: AC
  Filled 2012-10-23: qty 30

## 2012-10-23 MED ORDER — OXYCODONE-ACETAMINOPHEN 5-325 MG PO TABS: 1.0000 | ORAL_TABLET | ORAL | Status: DC | PRN

## 2012-10-23 MED ORDER — KETOROLAC TROMETHAMINE 30 MG/ML IJ SOLN
INTRAMUSCULAR | Status: AC
  Filled 2012-10-23: qty 1

## 2012-10-23 MED ORDER — CEFAZOLIN SODIUM 1-5 GM-% IV SOLN
1.0000 g | Freq: Three times a day (TID) | INTRAVENOUS | Status: AC
  Administered 2012-10-23 – 2012-10-24 (×2): 1 g via INTRAVENOUS
  Filled 2012-10-23 (×2): qty 50

## 2012-10-23 SURGICAL SUPPLY — 59 items
63080 ×1 IMPLANT
APL SKNCLS STERI-STRIP NONHPOA (GAUZE/BANDAGES/DRESSINGS) ×1
BENZOIN TINCTURE PRP APPL 2/3 (GAUZE/BANDAGES/DRESSINGS) ×3 IMPLANT
BLADE SURG ROTATE 9660 (MISCELLANEOUS) IMPLANT
BUR ROUND FLUTED 4 SOFT TCH (BURR) ×1 IMPLANT
CANISTER SUCTION 2500CC (MISCELLANEOUS) ×1 IMPLANT
CLOTH BEACON ORANGE TIMEOUT ST (SAFETY) ×2 IMPLANT
COLLAR CERV LO CONTOUR FIRM DE (SOFTGOODS) ×2 IMPLANT
COMPOSITE CERV ALLOGRAFT 7MM (Orthopedic Implant) ×2 IMPLANT
CORDS BIPOLAR (ELECTRODE) ×2 IMPLANT
COVER MAYO STAND STRL (DRAPES) ×1 IMPLANT
COVER SURGICAL LIGHT HANDLE (MISCELLANEOUS) ×2 IMPLANT
DRAPE C-ARM 42X72 X-RAY (DRAPES) ×2 IMPLANT
DRAPE MICROSCOPE LEICA (MISCELLANEOUS) ×2 IMPLANT
DRAPE PROXIMA HALF (DRAPES) ×2 IMPLANT
DRILL BIT VUELOCK 12MML (BIT) ×1 IMPLANT
DURAPREP 6ML APPLICATOR 50/CS (WOUND CARE) ×2 IMPLANT
ELECT COATED BLADE 2.86 ST (ELECTRODE) ×2 IMPLANT
ELECT REM PT RETURN 9FT ADLT (ELECTROSURGICAL) ×2
ELECTRODE REM PT RTRN 9FT ADLT (ELECTROSURGICAL) ×1 IMPLANT
EVACUATOR 1/8 PVC DRAIN (DRAIN) ×2 IMPLANT
GAUZE XEROFORM 1X8 LF (GAUZE/BANDAGES/DRESSINGS) ×2 IMPLANT
GLOVE BIOGEL PI IND STRL 7.0 (GLOVE) IMPLANT
GLOVE BIOGEL PI IND STRL 7.5 (GLOVE) ×1 IMPLANT
GLOVE BIOGEL PI IND STRL 8 (GLOVE) ×1 IMPLANT
GLOVE BIOGEL PI INDICATOR 7.0 (GLOVE) ×1
GLOVE BIOGEL PI INDICATOR 7.5 (GLOVE) ×1
GLOVE BIOGEL PI INDICATOR 8 (GLOVE) ×1
GLOVE ECLIPSE 7.0 STRL STRAW (GLOVE) ×2 IMPLANT
GLOVE ORTHO TXT STRL SZ7.5 (GLOVE) ×2 IMPLANT
GLOVE SURG SS PI 6.5 STRL IVOR (GLOVE) ×2 IMPLANT
GOWN PREVENTION PLUS LG XLONG (DISPOSABLE) ×1 IMPLANT
GOWN PREVENTION PLUS XLARGE (GOWN DISPOSABLE) ×2 IMPLANT
GOWN SRG XL XLNG 56XLVL 4 (GOWN DISPOSABLE) IMPLANT
GOWN STRL NON-REIN LRG LVL3 (GOWN DISPOSABLE) ×2 IMPLANT
GOWN STRL NON-REIN XL XLG LVL4 (GOWN DISPOSABLE) ×2
HEAD HALTER (SOFTGOODS) ×2 IMPLANT
HEMOSTAT SURGICEL 2X14 (HEMOSTASIS) IMPLANT
KIT BASIN OR (CUSTOM PROCEDURE TRAY) ×2 IMPLANT
KIT ROOM TURNOVER OR (KITS) ×2 IMPLANT
MANIFOLD NEPTUNE II (INSTRUMENTS) ×1 IMPLANT
NDL 25GX 5/8IN NON SAFETY (NEEDLE) ×1 IMPLANT
NEEDLE 25GX 5/8IN NON SAFETY (NEEDLE) ×2 IMPLANT
NS IRRIG 1000ML POUR BTL (IV SOLUTION) ×2 IMPLANT
PACK ORTHO CERVICAL (CUSTOM PROCEDURE TRAY) ×2 IMPLANT
PAD ARMBOARD 7.5X6 YLW CONV (MISCELLANEOUS) ×4 IMPLANT
PATTIES SURGICAL .5 X.5 (GAUZE/BANDAGES/DRESSINGS) IMPLANT
PLATE 32MM (Plate) ×1 IMPLANT
SCREW SELF TAP 4X14 (Screw) ×6 IMPLANT
SPONGE GAUZE 4X4 12PLY (GAUZE/BANDAGES/DRESSINGS) ×2 IMPLANT
STAPLER VISISTAT 35W (STAPLE) ×1 IMPLANT
STRIP CLOSURE SKIN 1/2X4 (GAUZE/BANDAGES/DRESSINGS) ×2 IMPLANT
SURGIFLO TRUKIT (HEMOSTASIS) IMPLANT
SUT VIC AB 3-0 X1 27 (SUTURE) ×2 IMPLANT
SUT VICRYL 4-0 PS2 18IN ABS (SUTURE) ×3 IMPLANT
SYR 30ML SLIP (SYRINGE) ×2 IMPLANT
TOWEL OR 17X24 6PK STRL BLUE (TOWEL DISPOSABLE) ×2 IMPLANT
TOWEL OR 17X26 10 PK STRL BLUE (TOWEL DISPOSABLE) ×2 IMPLANT
WATER STERILE IRR 1000ML POUR (IV SOLUTION) ×1 IMPLANT

## 2012-10-23 NOTE — Interval H&P Note (Signed)
History and Physical Interval Note:  10/23/2012 7:21 AM  Karen [name]  has presented today for surgery, with the diagnosis of C5-6, C6-7 Spondylosis  The various methods of treatment have been discussed with the patient and family. After consideration of risks, benefits and other options for treatment, the patient has consented to  Procedure(s) (LRB) with comments: ANTERIOR CERVICAL DECOMPRESSION/DISCECTOMY FUSION 2 LEVELS (N/A) - C5-6, C6-7  Anterior Cervical Discectomy and Fusion, allograft, plate as a surgical intervention .  The patient's history has been reviewed, patient examined, no change in status, stable for surgery.  I have reviewed the patient's chart and labs.  Questions were answered to the patient's satisfaction.     Marian Meneely C

## 2012-10-23 NOTE — Transfer of Care (Signed)
Immediate Anesthesia Transfer of Care Note  Patient: Karen [name]  Procedure(s) Performed: Procedure(s) (LRB) with comments: ANTERIOR CERVICAL DECOMPRESSION/DISCECTOMY FUSION 2 LEVELS (N/A) - C5-6, C6-7  Anterior Cervical Discectomy and Fusion, allograft, plate  Patient Location: PACU  Anesthesia Type:General  Level of Consciousness: awake, alert  and oriented  Airway & Oxygen Therapy: Patient Spontanous Breathing and Patient connected to nasal cannula oxygen  Post-op Assessment: Report given to PACU RN, Post -op Vital signs reviewed and stable and Patient moving all extremities X 4  Post vital signs: Reviewed and stable  Complications: No apparent anesthesia complications

## 2012-10-23 NOTE — Brief Op Note (Cosign Needed)
10/23/2012  9:41 AM  PATIENT:  Karen [name]  41 y.o. female  PRE-OPERATIVE DIAGNOSIS:  C5-6, C6-7 Spondylosis  POST-OPERATIVE DIAGNOSIS:  C5-6, C6-7 Spondylosis  PROCEDURE:  Procedure(s) (LRB) with comments: ANTERIOR CERVICAL DECOMPRESSION/DISCECTOMY FUSION 2 LEVELS (N/A) - C5-6, C6-7  Anterior Cervical Discectomy and Fusion, allograft, plate  SURGEON:  Surgeon(s) and Role:    * Eldred Manges, MD - Primary  PHYSICIAN ASSISTANT: Maud Deed PAC  ASSISTANTS: none   ANESTHESIA:   general  EBL:  Total I/O In: 1400 [I.V.:1400] Out: 100 [Blood:100]  BLOOD ADMINISTERED:none  DRAINS: (1) Hemovact drain(s) in the anterior neck with  Suction Open   LOCAL MEDICATIONS USED:  MARCAINE     SPECIMEN:  No Specimen  DISPOSITION OF SPECIMEN:  N/A  COUNTS:  YES  TOURNIQUET:  * No tourniquets in log *  DICTATION: .Note written in EPIC  PLAN OF CARE: Admit to inpatient   PATIENT DISPOSITION:  PACU - hemodynamically stable.   Delay start of Pharmacological VTE agent (>24hrs) due to surgical blood loss or risk of bleeding: yes

## 2012-10-23 NOTE — Anesthesia Postprocedure Evaluation (Signed)
  Anesthesia Post-op Note  Patient: Karen [name]  Procedure(s) Performed: Procedure(s) (LRB) with comments: ANTERIOR CERVICAL DECOMPRESSION/DISCECTOMY FUSION 2 LEVELS (N/A) - C5-6, C6-7  Anterior Cervical Discectomy and Fusion, allograft, plate  Patient Location: PACU  Anesthesia Type:General  Level of Consciousness: awake  Airway and Oxygen Therapy: Patient Spontanous Breathing  Post-op Pain: mild  Post-op Assessment: Post-op Vital signs reviewed  Post-op Vital Signs: Reviewed  Complications: No apparent anesthesia complications

## 2012-10-23 NOTE — Anesthesia Preprocedure Evaluation (Addendum)
Anesthesia Evaluation  Patient identified by MRN, date of birth, ID band Patient awake    Reviewed: Allergy & Precautions, H&P , NPO status , Patient's Chart, lab work & pertinent test results  Airway Mallampati: II TM Distance: >3 FB     Dental  (+) Teeth Intact and Dental Advidsory Given   Pulmonary  breath sounds clear to auscultation        Cardiovascular negative cardio ROS  Rhythm:Regular Rate:Normal     Neuro/Psych PSYCHIATRIC DISORDERS    GI/Hepatic negative GI ROS, Neg liver ROS,   Endo/Other  negative endocrine ROS  Renal/GU negative Renal ROS     Musculoskeletal   Abdominal   Peds  Hematology   Anesthesia Other Findings   Reproductive/Obstetrics                        Anesthesia Physical Anesthesia Plan  ASA: II  Anesthesia Plan: General   Post-op Pain Management:    Induction: Intravenous  Airway Management Planned: Oral ETT  Additional Equipment:   Intra-op Plan:   Post-operative Plan:   Informed Consent: I have reviewed the patients History and Physical, chart, labs and discussed the procedure including the risks, benefits and alternatives for the proposed anesthesia with the patient or authorized representative who has indicated his/her understanding and acceptance.   Dental Advisory Given and Dental advisory given  Plan Discussed with: CRNA, Anesthesiologist and Surgeon  Anesthesia Plan Comments:       Anesthesia Quick Evaluation

## 2012-10-23 NOTE — Op Note (Signed)
Preop diagnosis: C5-6, C6-7 cervical spondylosis  Postop diagnosis: Same  Procedure: C5-6, C6-7 anterior cervical discectomy and fusion allograft and plate  Surgeon Annell Greening M.D.  Assistant Maud Deed PA-C medically necessary and present for the entire procedure  Anesthesia GOT plus Marcaine skin local  Drains: One Hemovac neck  Operative procedure  After induction general anesthesia operculum patient had halter traction application prepping with chlor prep arms were tucked at the sides with yellow pads over the ulnar nerve. Neck was square workhouse still skin marker used for the plan incision starting at the midline extending to the left with a prominent skin fold at the appropriate level. Betadine Steri-Drape was applied sterile Melisande at the head and thyroid sheet and drapes.  Timeout procedure was completed Ancef was given 2 g prophylactically.  Incision was made starting at the midline extending to the left split and the platysma in line with skin fibers. Blunt dissection was performed one small superficial vein was coagulated with the cautery. Lungs goalie were identified and elevated. Carotid tubercles palpable palpated and short 25 needle was placed with a straight clamp and C-arm was draped brought in. Needle is in the C6-7 level it was moved up C5-6 which was was level that was planned to be operatively treated first  CM confirm this is appropriate level. Discectomy was performed self-retaining Cloward retractors were placed with deep blades right and left footplates cephalad and caudad. Operative microscope was draped brought in. Posterior aspect of the disc was removed down the posterior longitudinal ligament. Overhanging spurs were removed. Disc material was retained malignantly was removed dura was decompressed. There was significant mobility at this level and a 7 mm trial gave good distraction was tight 6 was a little bit loose and with the trials removed of her space  measured 5 mm. Endplate had been prepared and rasped after being curetted brief use of the proton intravertebral cages to flatten things out. Corticocancellous autograft was placed and a 20 cc syringe rehydrated with tapping saline solution marked anteriorly and then with traction applied to the CN VIII was countersunk 2 mm. Identical procedure was repeated at the C6-7 level. 7 mm graft was also placed at this level. At the C6-7 level there was Elting midline with disc material still retained by the ligament. Uncovertebral joints were stripped in gutters were cleared of bone spurs right left with the a 1 and 2 mm Kerrisons. Graft was countersunk 2 mm checked arthroscopy. A 32 mm VueLock Biomet plate was selected. 14 mm screws were placed after a C-arm confirmed good position AP and lateral. 6 screws were placed in like was placed with a stab incision using the trocar in line with skin incision and out technique on the left. Tourniquet was placed down in the mediastinum for any egressed operative field was dry. Carotid sheath contents were intact soft was an intact operative field was dry and platysma was reapproximated with 3-0 interrupted Vicryl sutures. 4 Vicryl subcuticular skin closure. Postop dressing and tape followed by a soft cervical collar. Patient tolerated the procedure well was neurologically intact recovering.  Signed Annell Greening M.D.

## 2012-10-23 NOTE — Progress Notes (Signed)
Patient ID: Karen [name], female   DOB: 1971/12/02, 42 y.o.   MRN: [id number] Anticipate discharge to home 10/24/12.  rx for percocet and robaxin and AVS on chart.

## 2012-10-23 NOTE — Anesthesia Procedure Notes (Signed)
Procedure Name: Intubation Date/Time: 10/23/2012 7:40 AM Performed by: Carmela Rima Pre-anesthesia Checklist: Patient identified, Timeout performed, Emergency Drugs available, Patient being monitored and Suction available Patient Re-evaluated:Patient Re-evaluated prior to inductionOxygen Delivery Method: Circle system utilized Preoxygenation: Pre-oxygenation with 100% oxygen Intubation Type: IV induction Ventilation: Mask ventilation without difficulty Laryngoscope Size: Mac and 3 Grade View: Grade II Tube type: Oral Tube size: 7.5 mm Number of attempts: 1 Placement Confirmation: ETT inserted through vocal cords under direct vision,  breath sounds checked- equal and bilateral and positive ETCO2 Secured at: 23 cm Tube secured with: Tape Dental Injury: Teeth and Oropharynx as per pre-operative assessment

## 2012-10-24 MED ORDER — HYDROCODONE-ACETAMINOPHEN 5-500 MG PO TABS: 1.0000 | ORAL_TABLET | Freq: Four times a day (QID) | ORAL | Status: DC | PRN

## 2012-10-24 MED ORDER — METHOCARBAMOL 500 MG PO TABS: 500.0000 mg | ORAL_TABLET | Freq: Three times a day (TID) | ORAL | Status: DC

## 2012-10-24 NOTE — Progress Notes (Signed)
Patient ID: Karen [name], female   DOB: 01/02/1971, 41 y.o.   MRN: [id number] Patient comfortable this morning. Drain was removed. Plan for discharge today. Prescriptions written for Vicodin and Robaxin. Followup Dr. Ophelia Charter in one week.

## 2012-10-26 ENCOUNTER — Encounter (HOSPITAL_COMMUNITY): Payer: Self-pay | Admitting: Orthopaedic Surgery

## 2012-11-02 NOTE — Discharge Summary (Signed)
Physician Discharge Summary  Patient ID: Karen [name] MRN: [id number] DOB/AGE: Dec 27, 1970 41 y.o.  Admit date: 10/23/2012 Discharge date: 11/02/2012  Admission Diagnoses:  Spondylosis, cervical C4-5 and C5-6  Discharge Diagnoses:  Principal Problem:  *Spondylosis, cervical same as above  Past Medical History  Diagnosis Date  . Depression   . Anemia     Surgeries: Procedure(s): ANTERIOR CERVICAL DECOMPRESSION/DISCECTOMY FUSION at C4-5 and C5-6, 2 LEVELS on 10/23/2012   Consultants (if any):  none  Discharged Condition: Improved  Hospital Course: Karen [name] is an 41 y.o. female who was admitted 10/23/2012 with a diagnosis of Spondylosis, cervical and went to the operating room on 10/23/2012 and underwent the above named procedures.    She was given perioperative antibiotics:  Anti-infectives     Start     Dose/Rate Route Frequency Ordered Stop   10/23/12 1600   ceFAZolin (ANCEF) IVPB 1 g/50 mL premix        1 g 100 mL/hr over 30 Minutes Intravenous Every 8 hours 10/23/12 1136 10/24/12 0101   10/22/12 1413   ceFAZolin (ANCEF) IVPB 2 g/50 mL premix        2 g 100 mL/hr over 30 Minutes Intravenous 60 min pre-op 10/22/12 1413 10/23/12 0744        .  She was given sequential compression devices, early ambulation for DVT prophylaxis.  She benefited maximally from the hospital stay and there were no complications.    Recent vital signs:  Filed Vitals:   10/23/12 2341  BP: 145/82  Pulse: 85  Temp: 97.8 F (36.6 C)  Resp: 18    Recent laboratory studies:  Lab Results  Component Value Date   HGB 13.2 10/15/2012   Lab Results  Component Value Date   WBC 8.9 10/15/2012   PLT 205 10/15/2012   No results found for this basename: INR   Lab Results  Component Value Date   NA 136 10/23/2012   K 3.4* 10/23/2012   CL 102 10/23/2012   CO2 24 10/23/2012   BUN 12 10/23/2012   CREATININE 0.71 10/23/2012   GLUCOSE 123* 10/23/2012    Discharge  Medications:     Medication List     As of 11/02/2012 12:43 PM    TAKE these medications         docusate sodium 100 MG capsule   Commonly known as: COLACE   Take 200 mg by mouth daily.      escitalopram 20 MG tablet   Commonly known as: LEXAPRO   Take 20 mg by mouth daily.      guaiFENesin 600 MG 12 hr tablet   Commonly known as: MUCINEX   Take 1,200 mg by mouth 2 (two) times daily as needed. For allergies      HYDROcodone-acetaminophen 5-325 MG per tablet   Commonly known as: NORCO/VICODIN   Take 1-2 tablets by mouth 2 (two) times daily as needed. For pain      HYDROcodone-acetaminophen 5-500 MG per tablet   Commonly known as: VICODIN   Take 1 tablet by mouth every 6 (six) hours as needed for pain.      methocarbamol 500 MG tablet   Commonly known as: ROBAXIN   Take 500 mg by mouth every 6 (six) hours as needed. For spasms      methocarbamol 500 MG tablet   Commonly known as: ROBAXIN   1 q6-8 hrs prn muscle spasm      methocarbamol 500 MG tablet   Commonly known as:  ROBAXIN   Take 1 tablet (500 mg total) by mouth 3 (three) times daily.      norethindrone-ethinyl estradiol 1-20 MG-MCG tablet   Commonly known as: JUNEL FE,GILDESS FE,LOESTRIN FE   Take 1 tablet by mouth daily.      oxyCODONE-acetaminophen 5-325 MG per tablet   Commonly known as: PERCOCET/ROXICET   1-2 tabs q 4-6 hrs prn pain        Diagnostic Studies: Dg Cervical Spine 2-3 Views  10/23/2012  *RADIOLOGY REPORT*  Clinical Data: Anterior fixation at C5-C6 and C6-C7.  DG C-ARM 1-60 MIN,CERVICAL SPINE - 2-3 VIEW  Comparison:  MRI of 08/24/2012.  Findings: 3 intraoperative views.  These demonstrate anterior plate and screw fixation at the C5-C6 and C6-C7 levels.  The C7 level is suboptimally evaluated due to overlying soft tissues.  No acute hardware complication is identified.  IMPRESSION: Anterior fixation at C5-C7.   Original Report Authenticated By: Jeronimo Greaves, M.D.    Dg C-arm 1-60  Min  10/23/2012  *RADIOLOGY REPORT*  Clinical Data: Anterior fixation at C5-C6 and C6-C7.  DG C-ARM 1-60 MIN,CERVICAL SPINE - 2-3 VIEW  Comparison:  MRI of 08/24/2012.  Findings: 3 intraoperative views.  These demonstrate anterior plate and screw fixation at the C5-C6 and C6-C7 levels.  The C7 level is suboptimally evaluated due to overlying soft tissues.  No acute hardware complication is identified.  IMPRESSION: Anterior fixation at C5-C7.   Original Report Authenticated By: Jeronimo Greaves, M.D.     Disposition: 01-Home or Self Care      Discharge Orders    Future Orders Please Complete By Expires   Diet - low sodium heart healthy      Call MD / Call 911      Comments:   If you experience chest pain or shortness of breath, CALL 911 and be transported to the hospital emergency room.  If you develope a fever above 101 F, pus (white drainage) or increased drainage or redness at the wound, or calf pain, call your surgeon's office.   Constipation Prevention      Comments:   Drink plenty of fluids.  Prune juice may be helpful.  You may use a stool softener, such as Colace (over the counter) 100 mg twice a day.  Use MiraLax (over the counter) for constipation as needed.   Increase activity slowly as tolerated       No lifting greater than 10 lbs. No overhead use of arms. Avoid bending,and twisting neck. Walk in house for first week them may start to get out slowly increasing distance up to one mile by 3 weeks post op. Keep incision dry for 3 days, may then bathe and wet incision using plastic wrap to cover soft collar when showering. Call if any fevers >101, chills, or increasing numbness or weakness or increased swelling or drainage.  Follow-up Information    Follow up with Eldred Manges, MD. Schedule an appointment as soon as possible for a visit in 2 weeks.   Contact information:   [address] [place] [address] [phone]           Signed: Wende Neighbors 11/02/2012,  12:43 PM

## 2014-08-29 IMAGING — RF DG CERVICAL SPINE 2 OR 3 VIEWS
1 series · 3 of 3 positions shown · non-contrast
Comparison: MRI of 08/24/2012.

CLINICAL DATA: Anterior fixation at C5-C6 and C6-C7.

DG C-ARM 1-60 MIN,CERVICAL SPINE - 2-3 VIEW

[Series 1: run · 3 of 3 slices shown]
[im 1/3]
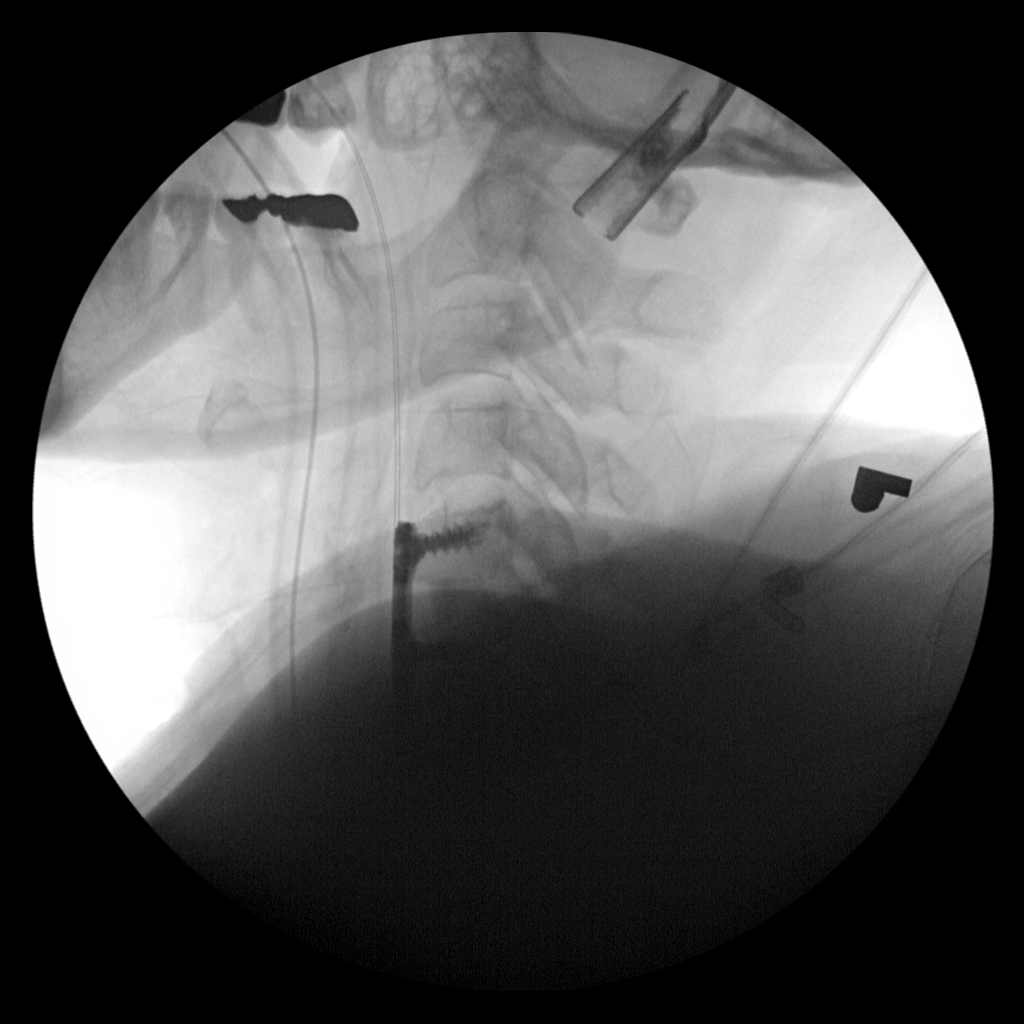
[im 2/3]
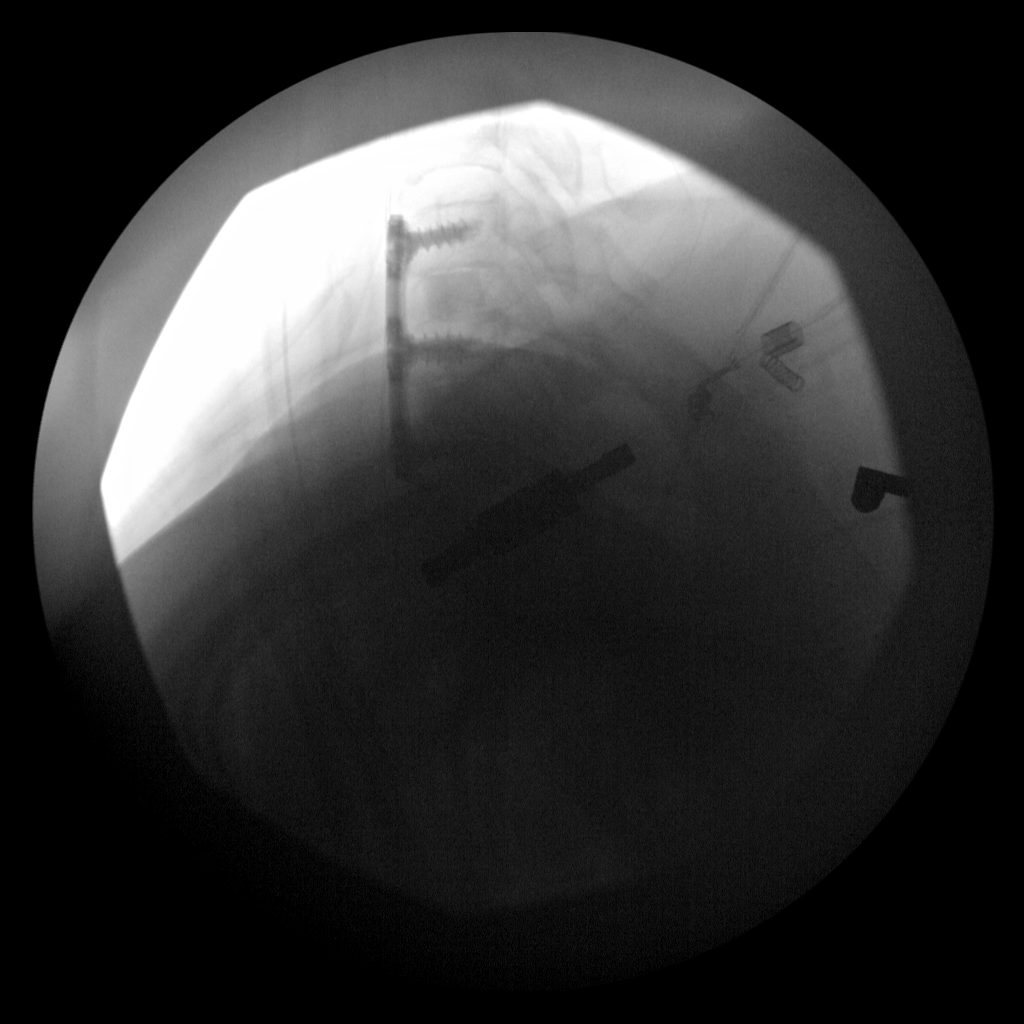
[im 3/3]
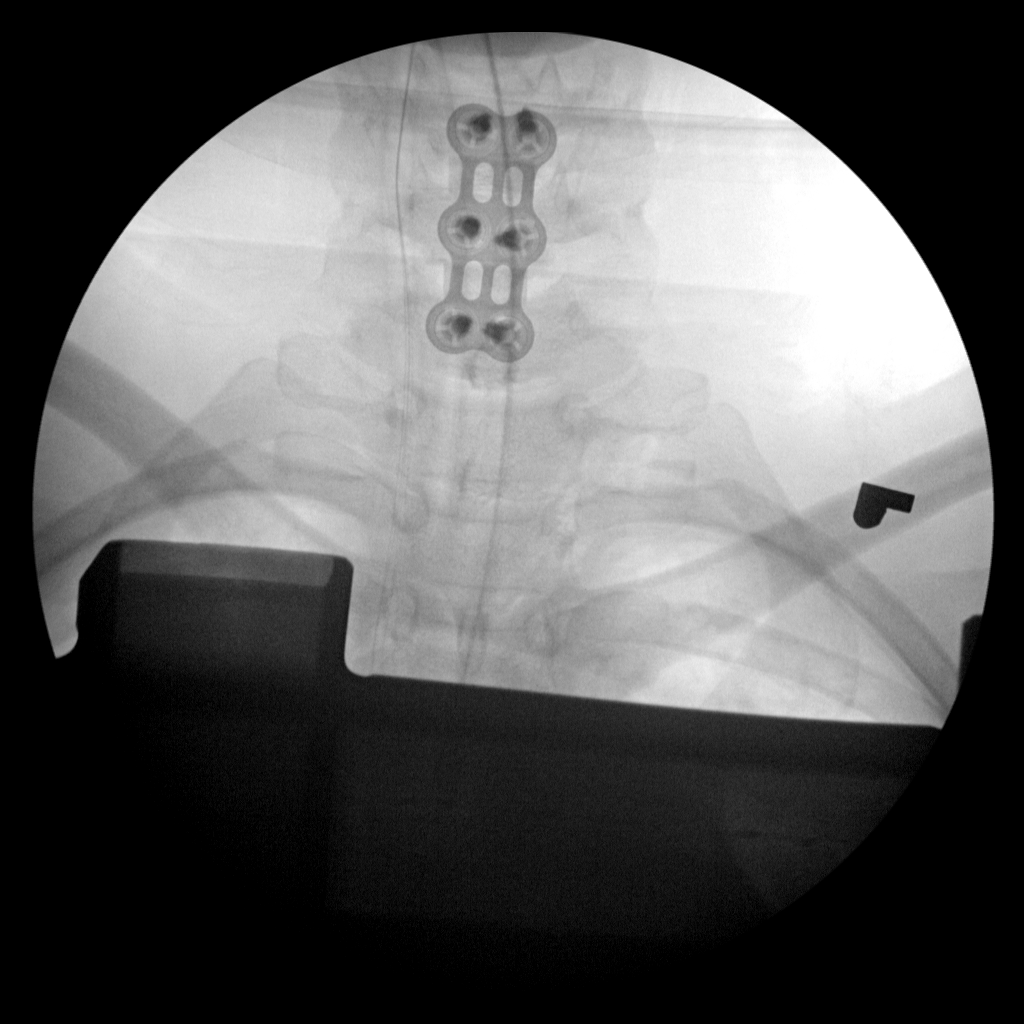

[3 of 3 positions shown; findings below may reference images not displayed]

FINDINGS: 3 intraoperative views.  These demonstrate anterior plate
and screw fixation at the C5-C6 and C6-C7 levels.  The C7 level is
suboptimally evaluated due to overlying soft tissues.  No acute
hardware complication is identified.
IMPRESSION: Anterior fixation at C5-C7.

## 2019-11-09 DIAGNOSIS — U071 COVID-19: Secondary | ICD-10-CM

## 2019-11-09 HISTORY — DX: COVID-19: U07.1

## 2020-07-31 ENCOUNTER — Other Ambulatory Visit: Payer: Self-pay | Admitting: Obstetrics and Gynecology

## 2020-08-17 ENCOUNTER — Encounter (HOSPITAL_COMMUNITY): Payer: Self-pay | Admitting: *Deleted

## 2020-08-17 ENCOUNTER — Other Ambulatory Visit: Payer: Self-pay

## 2020-08-17 NOTE — Progress Notes (Signed)
Spoke w/ via phone for pre-op interview---pt Lab needs dos---cbc, t & s-               Lab results------none COVID test ------08-21-2020 at 825 am Arrive at -------1130 am 08-23-2020  NPO after MN NO Solid Food.  Clear liquids from MN until---1030 am then npo Medications to take morning of surgery -----none Diabetic medication -----n/a Patient Special Instructions -----none Pre-Op special Istructions -----none Patient verbalized understanding of instructions that were given at this phone interview. Patient denies shortness of breath, chest pain, fever, cough at this phone interview.

## 2020-08-21 ENCOUNTER — Other Ambulatory Visit (HOSPITAL_COMMUNITY)
Admission: RE | Admit: 2020-08-21 | Discharge: 2020-08-21 | Disposition: A | Discharge status: Home / Self Care | Payer: Managed Care, Other (non HMO) | Source: Ambulatory Visit | Attending: Obstetrics and Gynecology | Admitting: Obstetrics and Gynecology

## 2020-08-21 DIAGNOSIS — Z01812 Encounter for preprocedural laboratory examination: Secondary | ICD-10-CM | POA: Diagnosis not present

## 2020-08-21 DIAGNOSIS — Z20822 Contact with and (suspected) exposure to covid-19: Secondary | ICD-10-CM | POA: Diagnosis not present

## 2020-08-21 LAB — SARS CORONAVIRUS 2 (TAT 6-24 HRS): SARS Coronavirus 2: NEGATIVE

## 2020-08-22 NOTE — H&P (Signed)
Karen [name] is an 49 y.o. female. PMB bleeding for diag hs  Pertinent Gynecological History: Menses: post-menopausal Bleeding: post menopausal bleeding Contraception: none DES exposure: denies Blood transfusions: none Sexually transmitted diseases: no past history Previous GYN Procedures: DNC  Last mammogram: normal Date: 2021 Last pap: normal Date: 2021 OB History: G2, P2   Menstrual History: Menarche age: 58 Patient's last menstrual period was 09/08/2013.    Past Medical History:  Diagnosis Date  . Anemia   . COVID-19 11/2019   symptoms x 2 weeks all resolved  . Depression   . Hypothyroidism   . PMB (postmenopausal bleeding)     Past Surgical History:  Procedure Laterality Date  . ANTERIOR CERVICAL DECOMP/DISCECTOMY FUSION  10/23/2012   Procedure: ANTERIOR CERVICAL DECOMPRESSION/DISCECTOMY FUSION 2 LEVELS;  Surgeon: Eldred Manges, MD;  Location: [facility] OR;  Service: Orthopedics;  Laterality: N/A;  C5-6, C6-7  Anterior Cervical Discectomy and Fusion, allograft, plate  . CARPAL TUNNEL RELEASE Right 2005  . TUBAL LIGATION  2006    History reviewed. No pertinent family history.  Social History:  reports that she has never smoked. She has never used smokeless tobacco. She reports current alcohol use. She reports that she does not use drugs.  Allergies: No Known Allergies  No medications prior to admission.    Review of Systems  Constitutional: Negative.   All other systems reviewed and are negative.   Height 5\' 2"  (1.575 m), weight 85.3 kg, last menstrual period 09/08/2013. Physical Exam Constitutional:      Appearance: Normal appearance.  HENT:     Head: Normocephalic and atraumatic.  Cardiovascular:     Rate and Rhythm: Normal rate and regular rhythm.     Pulses: Normal pulses.     Heart sounds: Normal heart sounds.  Pulmonary:     Effort: Pulmonary effort is normal.     Breath sounds: Normal breath sounds.  Abdominal:     General: Abdomen is flat.      Palpations: Abdomen is soft.  Genitourinary:    General: Normal vulva.  Musculoskeletal:        General: Normal range of motion.     Cervical back: Normal range of motion and neck supple.  Skin:    General: Skin is warm and dry.  Neurological:     General: No focal deficit present.     Mental Status: She is alert and oriented to person, place, and time.  Psychiatric:        Mood and Affect: Mood normal.        Behavior: Behavior normal.     No results found for this or any previous visit (from the past 24 hour(s)).  No results found.  Assessment/Plan: PMB Diag HS, D&C poss myosure Surgical risks discussed, Consent done.  Reola Buckles J 08/22/2020, 9:21 PM

## 2020-08-23 ENCOUNTER — Ambulatory Visit (HOSPITAL_BASED_OUTPATIENT_CLINIC_OR_DEPARTMENT_OTHER): Payer: Managed Care, Other (non HMO) | Admitting: Certified Registered Nurse Anesthetist

## 2020-08-23 ENCOUNTER — Ambulatory Visit (HOSPITAL_BASED_OUTPATIENT_CLINIC_OR_DEPARTMENT_OTHER)
Admission: RE | Admit: 2020-08-23 | Discharge: 2020-08-23 | Disposition: A | Discharge status: Home / Self Care | Payer: Managed Care, Other (non HMO) | Attending: Obstetrics and Gynecology | Admitting: Obstetrics and Gynecology

## 2020-08-23 ENCOUNTER — Encounter (HOSPITAL_BASED_OUTPATIENT_CLINIC_OR_DEPARTMENT_OTHER)
Admission: RE | Disposition: A | Discharge status: Home / Self Care | Payer: Self-pay | Source: Home / Self Care | Attending: Obstetrics and Gynecology

## 2020-08-23 ENCOUNTER — Other Ambulatory Visit: Payer: Self-pay

## 2020-08-23 ENCOUNTER — Encounter (HOSPITAL_BASED_OUTPATIENT_CLINIC_OR_DEPARTMENT_OTHER): Payer: Self-pay | Admitting: Obstetrics and Gynecology

## 2020-08-23 DIAGNOSIS — N840 Polyp of corpus uteri: Secondary | ICD-10-CM | POA: Insufficient documentation

## 2020-08-23 DIAGNOSIS — N950 Postmenopausal bleeding: Secondary | ICD-10-CM | POA: Diagnosis not present

## 2020-08-23 DIAGNOSIS — Z6834 Body mass index (BMI) 34.0-34.9, adult: Secondary | ICD-10-CM | POA: Insufficient documentation

## 2020-08-23 DIAGNOSIS — Z8616 Personal history of COVID-19: Secondary | ICD-10-CM | POA: Diagnosis not present

## 2020-08-23 DIAGNOSIS — D649 Anemia, unspecified: Secondary | ICD-10-CM | POA: Insufficient documentation

## 2020-08-23 DIAGNOSIS — E039 Hypothyroidism, unspecified: Secondary | ICD-10-CM | POA: Insufficient documentation

## 2020-08-23 DIAGNOSIS — F329 Major depressive disorder, single episode, unspecified: Secondary | ICD-10-CM | POA: Diagnosis not present

## 2020-08-23 DIAGNOSIS — Z981 Arthrodesis status: Secondary | ICD-10-CM | POA: Diagnosis not present

## 2020-08-23 DIAGNOSIS — M1990 Unspecified osteoarthritis, unspecified site: Secondary | ICD-10-CM | POA: Insufficient documentation

## 2020-08-23 DIAGNOSIS — E669 Obesity, unspecified: Secondary | ICD-10-CM | POA: Insufficient documentation

## 2020-08-23 HISTORY — PX: DILATATION & CURETTAGE/HYSTEROSCOPY WITH MYOSURE: SHX6511

## 2020-08-23 HISTORY — DX: Hypothyroidism, unspecified: E03.9

## 2020-08-23 HISTORY — DX: Postmenopausal bleeding: N95.0

## 2020-08-23 LAB — CBC
HCT: 44.5 % (ref 36.0–46.0)
Hemoglobin: 14.8 g/dL (ref 12.0–15.0)
MCH: 30.1 pg (ref 26.0–34.0)
MCHC: 33.3 g/dL (ref 30.0–36.0)
MCV: 90.4 fL (ref 80.0–100.0)
Platelets: 229 10*3/uL (ref 150–400)
RBC: 4.92 MIL/uL (ref 3.87–5.11)
RDW: 12.9 % (ref 11.5–15.5)
WBC: 7.8 10*3/uL (ref 4.0–10.5)
nRBC: 0 % (ref 0.0–0.2)

## 2020-08-23 SURGERY — DILATATION & CURETTAGE/HYSTEROSCOPY WITH MYOSURE
Anesthesia: Monitor Anesthesia Care | Site: Uterus

## 2020-08-23 MED ORDER — TRAMADOL HCL 50 MG PO TABS: 50.0000 mg | ORAL_TABLET | Freq: Four times a day (QID) | ORAL | 0 refills | Status: AC | PRN

## 2020-08-23 MED ORDER — MIDAZOLAM HCL 5 MG/5ML IJ SOLN
INTRAMUSCULAR | Status: DC | PRN
  Administered 2020-08-23: 2 mg via INTRAVENOUS

## 2020-08-23 MED ORDER — FENTANYL CITRATE (PF) 100 MCG/2ML IJ SOLN
INTRAMUSCULAR | Status: DC | PRN
Start: 2020-08-23 — End: 2020-08-23
  Administered 2020-08-23 (×2): 50 ug via INTRAVENOUS

## 2020-08-23 MED ORDER — CEFAZOLIN SODIUM-DEXTROSE 2-4 GM/100ML-% IV SOLN
INTRAVENOUS | Status: AC
  Filled 2020-08-23: qty 100

## 2020-08-23 MED ORDER — LACTATED RINGERS IV SOLN: INTRAVENOUS | Status: DC

## 2020-08-23 MED ORDER — DEXAMETHASONE SODIUM PHOSPHATE 10 MG/ML IJ SOLN
INTRAMUSCULAR | Status: AC
  Filled 2020-08-23: qty 1

## 2020-08-23 MED ORDER — PROPOFOL 10 MG/ML IV BOLUS
INTRAVENOUS | Status: AC
  Filled 2020-08-23: qty 20

## 2020-08-23 MED ORDER — VASOPRESSIN 20 UNIT/ML IV SOLN
INTRAVENOUS | Status: DC | PRN
  Administered 2020-08-23: 20 mL via INTRAMUSCULAR

## 2020-08-23 MED ORDER — HYDRALAZINE HCL 20 MG/ML IJ SOLN
INTRAMUSCULAR | Status: DC | PRN
  Administered 2020-08-23: 10 mg via INTRAVENOUS

## 2020-08-23 MED ORDER — BUPIVACAINE HCL (PF) 0.25 % IJ SOLN
INTRAMUSCULAR | Status: DC | PRN
  Administered 2020-08-23: 20 mL

## 2020-08-23 MED ORDER — POVIDONE-IODINE 10 % EX SWAB: 2.0000 "application " | Freq: Once | CUTANEOUS | Status: DC

## 2020-08-23 MED ORDER — PROPOFOL 10 MG/ML IV BOLUS
INTRAVENOUS | Status: DC | PRN
  Administered 2020-08-23: 20 mg via INTRAVENOUS
  Administered 2020-08-23: 40 mg via INTRAVENOUS

## 2020-08-23 MED ORDER — CEFAZOLIN SODIUM-DEXTROSE 2-4 GM/100ML-% IV SOLN
2.0000 g | INTRAVENOUS | Status: AC
  Administered 2020-08-23: 2 g via INTRAVENOUS

## 2020-08-23 MED ORDER — FENTANYL CITRATE (PF) 100 MCG/2ML IJ SOLN
INTRAMUSCULAR | Status: AC
  Filled 2020-08-23: qty 2

## 2020-08-23 MED ORDER — MIDAZOLAM HCL 2 MG/2ML IJ SOLN
INTRAMUSCULAR | Status: AC
  Filled 2020-08-23: qty 2

## 2020-08-23 MED ORDER — ONDANSETRON HCL 4 MG/2ML IJ SOLN
INTRAMUSCULAR | Status: DC | PRN
  Administered 2020-08-23: 4 mg via INTRAVENOUS

## 2020-08-23 MED ORDER — PROPOFOL 500 MG/50ML IV EMUL
INTRAVENOUS | Status: DC | PRN
  Administered 2020-08-23: 125 ug/kg/min via INTRAVENOUS

## 2020-08-23 MED ORDER — ONDANSETRON HCL 4 MG/2ML IJ SOLN
INTRAMUSCULAR | Status: AC
  Filled 2020-08-23: qty 2

## 2020-08-23 SURGICAL SUPPLY — 20 items
CATH ROBINSON RED A/P 16FR (CATHETERS) ×3 IMPLANT
DECANTER SPIKE VIAL GLASS SM (MISCELLANEOUS) ×2 IMPLANT
DEVICE MYOSURE LITE (MISCELLANEOUS) IMPLANT
DEVICE MYOSURE REACH (MISCELLANEOUS) ×2 IMPLANT
GLOVE BIO SURGEON STRL SZ7.5 (GLOVE) ×3 IMPLANT
GLOVE BIOGEL PI IND STRL 7.0 (GLOVE) ×1 IMPLANT
GLOVE BIOGEL PI INDICATOR 7.0 (GLOVE) ×2
GOWN STRL REUS W/TWL LRG LVL3 (GOWN DISPOSABLE) ×6 IMPLANT
IV NS 250ML (IV SOLUTION) ×3
IV NS 250ML BAXH (IV SOLUTION) IMPLANT
IV NS IRRIG 3000ML ARTHROMATIC (IV SOLUTION) ×3 IMPLANT
KIT PROCEDURE FLUENT (KITS) ×3 IMPLANT
NDL SPNL 22GX3.5 QUINCKE BK (NEEDLE) ×1 IMPLANT
NEEDLE SPNL 22GX3.5 QUINCKE BK (NEEDLE) ×3 IMPLANT
PACK VAGINAL MINOR WOMEN LF (CUSTOM PROCEDURE TRAY) ×3 IMPLANT
PAD OB MATERNITY 4.3X12.25 (PERSONAL CARE ITEMS) ×3 IMPLANT
SEAL CERVICAL OMNI LOK (ABLATOR) IMPLANT
SEAL ROD LENS SCOPE MYOSURE (ABLATOR) ×3 IMPLANT
SUT CHROMIC 2 0 CT 1 (SUTURE) ×2 IMPLANT
SYR CONTROL 10ML LL (SYRINGE) ×2 IMPLANT

## 2020-08-23 NOTE — Progress Notes (Signed)
Patient seen and examined. Consent witnessed and signed. No changes noted. Update completed. BP (!) 158/96   Pulse 88   Temp 98.5 F (36.9 C) (Oral)   Resp 16   Ht 5\' 2"  (1.575 m)   Wt 86.2 kg   LMP 09/08/2013   SpO2 100%   BMI 34.77 kg/m   CBC    Component Value Date/Time   WBC 7.8 08/23/2020 1200   RBC 4.92 08/23/2020 1200   HGB 14.8 08/23/2020 1200   HCT 44.5 08/23/2020 1200   PLT 229 08/23/2020 1200   MCV 90.4 08/23/2020 1200   MCH 30.1 08/23/2020 1200   MCHC 33.3 08/23/2020 1200   RDW 12.9 08/23/2020 1200

## 2020-08-23 NOTE — Anesthesia Preprocedure Evaluation (Addendum)
Anesthesia Evaluation  Patient identified by MRN, date of birth, ID band  Reviewed: Allergy & Precautions, NPO status , Patient's Chart, lab work & pertinent test results  Airway Mallampati: II  TM Distance: >3 FB Neck ROM: Full   Comment: Large tonsils Dental no notable dental hx.    Pulmonary neg pulmonary ROS,    Pulmonary exam normal breath sounds clear to auscultation       Cardiovascular Exercise Tolerance: Good negative cardio ROS Normal cardiovascular exam Rhythm:Regular Rate:Normal     Neuro/Psych PSYCHIATRIC DISORDERS Depression negative neurological ROS     GI/Hepatic negative GI ROS, Neg liver ROS,   Endo/Other  Hypothyroidism   Renal/GU Obesity BMI 35  negative genitourinary   Musculoskeletal  (+) Arthritis  (s/p cervical spine surgery), Osteoarthritis,    Abdominal   Peds negative pediatric ROS (+)  Hematology  (+) anemia ,   Anesthesia Other Findings Recent 65 pound weight loss (intentional)  Reproductive/Obstetrics                            Anesthesia Physical Anesthesia Plan  ASA: II  Anesthesia Plan: MAC   Post-op Pain Management:    Induction:   PONV Risk Score and Plan: 2 and Propofol infusion, TIVA and Treatment may vary due to age or medical condition  Airway Management Planned: Natural Airway and Simple Face Mask  Additional Equipment:   Intra-op Plan:   Post-operative Plan:   Informed Consent: I have reviewed the patients History and Physical, chart, labs and discussed the procedure including the risks, benefits and alternatives for the proposed anesthesia with the patient or authorized representative who has indicated his/her understanding and acceptance.       Plan Discussed with: CRNA, Anesthesiologist and Surgeon  Anesthesia Plan Comments: Martie Round airway Propofol gtt. GA/LMA as backup plan.)        Anesthesia Quick Evaluation

## 2020-08-23 NOTE — Anesthesia Postprocedure Evaluation (Signed)
Anesthesia Post Note  Patient: Kyndahl Rosenbloom  Procedure(s) Performed: DILATATION & CURETTAGE/HYSTEROSCOPY WITH MYOSURE (N/A Uterus)     Patient location during evaluation: PACU Anesthesia Type: MAC Level of consciousness: awake and alert Pain management: pain level controlled Vital Signs Assessment: post-procedure vital signs reviewed and stable Respiratory status: spontaneous breathing and respiratory function stable Cardiovascular status: stable Postop Assessment: no apparent nausea or vomiting Anesthetic complications: no   No complications documented.  Last Vitals:  Vitals:   08/23/20 1417 08/23/20 1430  BP: (!) 151/79 (!) 154/74  Pulse: 88 88  Resp: (!) 8 16  Temp: 36.6 C   SpO2: 100% 97%    Last Pain:  Vitals:   08/23/20 1445  TempSrc:   PainSc: 0-No pain                 Merlinda Frederick

## 2020-08-23 NOTE — Transfer of Care (Addendum)
Immediate Anesthesia Transfer of Care Note  Patient: Karen [name]  Procedure(s) Performed: DILATATION & CURETTAGE/HYSTEROSCOPY WITH MYOSURE (N/A Uterus)  Patient Location: PACU  Anesthesia Type:MAC  Level of Consciousness: awake and sedated  Airway & Oxygen Therapy: Patient Spontanous Breathing  Post-op Assessment: Report given to RN and Post -op Vital signs reviewed and stable  Post vital signs: Reviewed  Last Vitals:  Vitals Value Taken Time  BP 130/77 08/23/20 1445  Temp 36.6 C 08/23/20 1417  Pulse 81 08/23/20 1446  Resp 15 08/23/20 1446  SpO2 98 % 08/23/20 1446  Vitals shown include unvalidated device data.  Last Pain:  Vitals:   08/23/20 1445  TempSrc:   PainSc: 0-No pain      Patients Stated Pain Goal: 6 (69/40/98 2867)  Complications: No complications documented.

## 2020-08-23 NOTE — Op Note (Signed)
NAMECURTISTINE, [name] MEDICAL RECORD NI:[id number] ACCOUNT [id number] DATE OF BIRTH:06/25/1971 FACILITY: [facility] LOCATION: [facility] PHYSICIAN:Bensyn Bornemann J. Billy Coast, MD  OPERATIVE REPORT  DATE OF PROCEDURE:  08/23/2020  PREOPERATIVE DIAGNOSIS:  Postmenopausal bleeding with structural lesion.  POSTOPERATIVE DIAGNOSIS:  Large endometrial polyp.  PROCEDURE:  Diagnostic hysteroscopy, dilatation and curettage, MyoSure resection of large endometrial polyp.  SURGEON:  Olivia Mackie, MD  ASSISTANT:  None  ANESTHESIA:  Local and general.  ESTIMATED BLOOD LOSS:  Less than 50 mL.  FLUID DEFICIT:  Less than 50 mL.  COMPLICATIONS:  None.  DRAINS:  None.  COUNTS:  Correct.  SPECIMENS:  Endometrial polyp and endometrial curettings to pathology.  DISPOSITION:  The patient was taken to recovery in good condition.  BRIEF OPERATIVE NOTE:  After being apprised of the risks of anesthesia, infection, bleeding, injury to surrounding organs, possible need for repair, delayed versus immediate complications to include bowel and bladder injury, internal vessel injury,  possible need for repair was brought to the patient's consent is signed.  She was brought to the operating room where she was placed in dorsal lithotomy position, prepped and draped in usual sterile fashion after administering a general anesthetic.  At  this time, exam under anesthesia reveals an anteflexed uterus and no adnexal masses.  Dilute Pitressin solution was placed at 3 and 9 o'clock, 20 mL total dilute Marcaine solution.  Standard paracervical block, 20 mL total.  Cervix easily dilated up to  #19 Pratt dilator.  Hysteroscope placed.  Visualization reveals a large posterior wall sessile endometrial polyp, normal tubal ostia.  No other endometrial cavity defects noted.  MyoSure device Reach is entered and the polyp was resected down to the  base.  Minimal bleeding noted.  Sharp curettage in a 4-quadrant method was performed.   Scant tissue was collected for pathology and sent separately.  At this time, minimal bleeding was noted except on the tenaculum site, which has slightly torn the  anterior lip of the cervix.  This was sutured using 1 interrupted suture of 0 chromic suture.  All instruments were then removed.  The patient tolerated the procedure well, was awakened and transferred to recovery in good condition.  CN/NUANCE  D:08/23/2020 T:08/23/2020 JOB:[id number]

## 2020-08-23 NOTE — Discharge Instructions (Signed)

## 2020-08-23 NOTE — Op Note (Signed)
08/23/2020  2:17 PM  PATIENT:  Adela Lank Justiniano  49 y.o. female  PRE-OPERATIVE DIAGNOSIS:  Postmenopausal Bleeding  POST-OPERATIVE DIAGNOSIS:  Postmenopausal Bleeding  PROCEDURE:  Procedure(s): DILATATION & CURETTAGE HYSTEROSCOPY WITH MYOSURE RESECTION OF  LARGE ENDOMETRIAL POLYP  SURGEON:  Surgeon(s): Olivia Mackie, MD  ASSISTANTS: none   ANESTHESIA:   local and general  ESTIMATED BLOOD LOSS: MINIMAL  DRAINS: none   LOCAL MEDICATIONS USED:  MARCAINE    and Amount: 20 ml  SPECIMEN:  Source of Specimen:  [facility] AND POLYP  DISPOSITION OF SPECIMEN:  PATHOLOGY  COUNTS:  YES  DICTATION #: [id number]  PLAN OF CARE: DC HOME  PATIENT DISPOSITION:  PACU - hemodynamically stable.

## 2020-08-24 ENCOUNTER — Encounter (HOSPITAL_BASED_OUTPATIENT_CLINIC_OR_DEPARTMENT_OTHER): Payer: Self-pay | Admitting: Obstetrics and Gynecology

## 2020-08-24 LAB — SURGICAL PATHOLOGY
